# Patient Record
Sex: Female | Born: 1935 | Race: White | Hispanic: No | Smoking: Never smoker
Health system: Southern US, Community
[De-identification: ages and names within clinical notes are randomized; demographics above are authoritative.]

## PROBLEM LIST (undated history)

## (undated) DIAGNOSIS — E079 Disorder of thyroid, unspecified: Secondary | ICD-10-CM

## (undated) DIAGNOSIS — E7800 Pure hypercholesterolemia, unspecified: Secondary | ICD-10-CM

## (undated) DIAGNOSIS — K279 Peptic ulcer, site unspecified, unspecified as acute or chronic, without hemorrhage or perforation: Secondary | ICD-10-CM

## (undated) DIAGNOSIS — F0390 Unspecified dementia without behavioral disturbance: Secondary | ICD-10-CM

## (undated) DIAGNOSIS — K5900 Constipation, unspecified: Secondary | ICD-10-CM

## (undated) HISTORY — PX: TONSILLECTOMY: SUR1361

## (undated) HISTORY — PX: HERNIA REPAIR: SHX51

---

## 1999-12-19 ENCOUNTER — Ambulatory Visit (HOSPITAL_COMMUNITY): Admission: RE | Admit: 1999-12-19 | Discharge: 1999-12-19 | Payer: Self-pay | Admitting: Gastroenterology

## 2001-06-29 ENCOUNTER — Ambulatory Visit (HOSPITAL_BASED_OUTPATIENT_CLINIC_OR_DEPARTMENT_OTHER): Admission: RE | Admit: 2001-06-29 | Discharge: 2001-06-29 | Payer: Self-pay | Admitting: General Surgery

## 2001-07-09 ENCOUNTER — Other Ambulatory Visit: Admission: RE | Admit: 2001-07-09 | Discharge: 2001-07-09 | Payer: Self-pay | Admitting: Family Medicine

## 2002-08-17 ENCOUNTER — Other Ambulatory Visit: Admission: RE | Admit: 2002-08-17 | Discharge: 2002-08-17 | Payer: Self-pay | Admitting: *Deleted

## 2003-09-15 ENCOUNTER — Other Ambulatory Visit: Admission: RE | Admit: 2003-09-15 | Discharge: 2003-09-15 | Payer: Self-pay | Admitting: Family Medicine

## 2004-07-18 ENCOUNTER — Ambulatory Visit (HOSPITAL_COMMUNITY): Admission: RE | Admit: 2004-07-18 | Discharge: 2004-07-18 | Payer: Self-pay | Admitting: Gastroenterology

## 2004-09-13 ENCOUNTER — Other Ambulatory Visit: Admission: RE | Admit: 2004-09-13 | Discharge: 2004-09-13 | Payer: Self-pay | Admitting: Family Medicine

## 2008-03-02 ENCOUNTER — Ambulatory Visit: Admission: RE | Admit: 2008-03-02 | Discharge: 2008-03-02 | Payer: Self-pay | Admitting: Family Medicine

## 2008-03-02 ENCOUNTER — Encounter (INDEPENDENT_AMBULATORY_CARE_PROVIDER_SITE_OTHER): Payer: Self-pay | Admitting: Family Medicine

## 2008-03-02 ENCOUNTER — Ambulatory Visit: Payer: Self-pay | Admitting: Vascular Surgery

## 2009-02-23 ENCOUNTER — Encounter: Admission: RE | Admit: 2009-02-23 | Discharge: 2009-02-23 | Payer: Self-pay | Admitting: Family Medicine

## 2010-02-21 ENCOUNTER — Ambulatory Visit: Payer: Self-pay | Admitting: Vascular Surgery

## 2010-02-21 ENCOUNTER — Ambulatory Visit: Admission: RE | Admit: 2010-02-21 | Discharge: 2010-02-21 | Payer: Self-pay | Admitting: Family Medicine

## 2010-02-21 ENCOUNTER — Encounter (INDEPENDENT_AMBULATORY_CARE_PROVIDER_SITE_OTHER): Payer: Self-pay | Admitting: Family Medicine

## 2010-05-12 ENCOUNTER — Encounter: Payer: Self-pay | Admitting: Family Medicine

## 2010-09-07 NOTE — Op Note (Signed)
NAMEJOURDEN, DELMONT NO.:  [id number]   MEDICAL RECORD NO.:  [id number]          PATIENT TYPE:  AMB   LOCATION:  ENDO                         FACILITY:  [facility]   PHYSICIAN:  Danise Edge, M.D.   DATE OF BIRTH:  Aug 31, 1930   DATE OF PROCEDURE:  07/18/2004  DATE OF DISCHARGE:                                 OPERATIVE REPORT   PROCEDURE:  Surveillance colonoscopy   INDICATIONS:  Ms. Ashley Chambers is a 77 year old female born January 16, 1931. Ms. Ashley Chambers has undergone colonoscopic exams in the past to remove  neoplastic but noncancerous colon polyps. Her surveillance proctocolonoscopy  to the cecum in 2001 was normal.   ENDOSCOPIST:  Danise Edge, M.D.   PREMEDICATION:  Versed 5 mg , Demerol 50 mg.   PROCEDURE:  After obtaining informed consent, Ms. Ashley Chambers was placed in the  left lateral decubitus position. I administered intravenous Demerol and  intravenous Versed to achieve conscious sedation for the procedure. The  patient's blood pressure, oxygen saturation and cardiac rhythm were  monitored throughout the procedure and documented in the medical record.   Anal inspection and digital rectal exam were normal. The Olympus adjustable  pediatric colonoscope was introduced into the rectum and advanced to the  cecum. Colonic preparation exam today was excellent.   Rectum normal.  Sigmoid colon and descending colon normal.  Splenic flexure normal.  Transverse colon normal.  Hepatic flexure normal.  Ascending colon normal.  Cecum and ileocecal valve normal.   ASSESSMENT:  Normal proctocolonoscopy to the cecum.   RECOMMENDATIONS:  Repeat colonoscopy in March 2011.      MJ/MEDQ  D:  07/18/2004  T:  07/18/2004  Job:  [id number]   cc:   Tally Joe, M.D.  [address] [place] [address]  [place], [place] [address]  Fax: [phone]

## 2010-09-07 NOTE — Procedures (Signed)
[facility]  Patient:    Ashley Chambers, Ashley Chambers                        MRN: [id number] Proc. Date: 12/19/99 Attending:  Verlin Grills, M.D. CC:         Willis Modena. Dreiling, M.D.   Procedure Report  PROCEDURE:  Colonoscopy.  INDICATION:  Ashley Chambers is a 77 year old female who has undergone colonoscopic exams in the past with removal of neoplastic, but noncancerous colon polyps.  She is scheduled for surveillance colonoscopy today.  I discussed with Ashley Chambers the complications associated with colonoscopy and polypectomy including a 15 per 1000 risk of bleeding and 4 per 1000 risk of intestinal perforation which would require emergency surgery.  Ashley Chambers has signed the operative permit.  ENDOSCOPIST:  Verlin Grills, M.D.  PREMEDICATION:  Versed 5 mg, Demerol 50 mg.  ENDOSCOPE:  Olympus pediatric colonoscope.  DESCRIPTION OF PROCEDURE:  After obtaining informed consent, the patient was placed in the left lateral decubitus position.  I administered intravenous Demerol and intravenous Versed to achieve conscious sedation for the procedure.  The patients blood pressure, oxygen saturations, and cardiac rhythm were monitored throughout the procedure and documented in the record.  Anal inspection was normal.  Digital rectal exam was normal.  The Olympus pediatric video colonoscope was introduced into the rectum and under direct vision advanced to the cecum, as identified by a normal appearing ileocecal valve and appendiceal orifice.  Colonic preparation for the exam today was excellent.  The patient received Fleets Phospho-Soda preoperatively for the colonoscopy today.  INTRAOPERATIVE FINDINGS: 1. Rectum - normal. 2. Sigmoid colon and descending colon - normal. 3. Splenic flexure - normal. 4. Transverse colon - normal. 5. Hepatic flexure - normal. 6. Ascending colon - normal. 7. Cecum and ileocecal valve - normal.  ASSESSMENT:   Normal proctocolonoscopy to the cecum.  RECOMMENDATIONS:  Repeat colonoscopy in September 2006. DD:  12/19/99 TD:  12/19/99 Job: [id number]

## 2011-02-14 ENCOUNTER — Other Ambulatory Visit (HOSPITAL_BASED_OUTPATIENT_CLINIC_OR_DEPARTMENT_OTHER): Payer: Self-pay | Admitting: Family Medicine

## 2011-02-14 DIAGNOSIS — Z1231 Encounter for screening mammogram for malignant neoplasm of breast: Secondary | ICD-10-CM

## 2011-03-04 ENCOUNTER — Ambulatory Visit (HOSPITAL_BASED_OUTPATIENT_CLINIC_OR_DEPARTMENT_OTHER)
Admission: RE | Admit: 2011-03-04 | Discharge: 2011-03-04 | Disposition: A | Discharge status: Home / Self Care | Payer: PRIVATE HEALTH INSURANCE | Source: Ambulatory Visit | Attending: Family Medicine | Admitting: Family Medicine

## 2011-03-04 DIAGNOSIS — Z1231 Encounter for screening mammogram for malignant neoplasm of breast: Secondary | ICD-10-CM | POA: Insufficient documentation

## 2012-02-27 ENCOUNTER — Other Ambulatory Visit (HOSPITAL_BASED_OUTPATIENT_CLINIC_OR_DEPARTMENT_OTHER): Payer: Self-pay | Admitting: Family Medicine

## 2012-02-27 DIAGNOSIS — Z1231 Encounter for screening mammogram for malignant neoplasm of breast: Secondary | ICD-10-CM

## 2012-03-04 ENCOUNTER — Ambulatory Visit (HOSPITAL_BASED_OUTPATIENT_CLINIC_OR_DEPARTMENT_OTHER)
Admission: RE | Admit: 2012-03-04 | Discharge: 2012-03-04 | Disposition: A | Discharge status: Home / Self Care | Payer: Medicare Other | Source: Ambulatory Visit | Attending: Family Medicine | Admitting: Family Medicine

## 2012-03-04 DIAGNOSIS — Z1231 Encounter for screening mammogram for malignant neoplasm of breast: Secondary | ICD-10-CM | POA: Insufficient documentation

## 2012-04-30 ENCOUNTER — Ambulatory Visit: Payer: Medicare Other | Admitting: Physical Therapy

## 2012-05-04 ENCOUNTER — Ambulatory Visit: Payer: Medicare Other | Attending: Physical Medicine and Rehabilitation | Admitting: Physical Therapy

## 2012-05-04 DIAGNOSIS — M545 Low back pain, unspecified: Secondary | ICD-10-CM | POA: Insufficient documentation

## 2012-05-04 DIAGNOSIS — IMO0001 Reserved for inherently not codable concepts without codable children: Secondary | ICD-10-CM | POA: Insufficient documentation

## 2012-05-08 ENCOUNTER — Ambulatory Visit: Payer: Medicare Other | Admitting: Physical Therapy

## 2012-05-11 ENCOUNTER — Ambulatory Visit: Payer: Medicare Other | Admitting: Physical Therapy

## 2012-05-15 ENCOUNTER — Ambulatory Visit: Payer: Medicare Other | Admitting: Rehabilitation

## 2012-05-18 ENCOUNTER — Ambulatory Visit: Payer: Medicare Other | Admitting: Physical Therapy

## 2012-05-22 ENCOUNTER — Ambulatory Visit: Payer: Medicare Other | Admitting: Rehabilitation

## 2012-05-25 ENCOUNTER — Ambulatory Visit: Payer: Medicare Other | Attending: Physical Medicine and Rehabilitation | Admitting: Physical Therapy

## 2012-05-25 DIAGNOSIS — IMO0001 Reserved for inherently not codable concepts without codable children: Secondary | ICD-10-CM | POA: Insufficient documentation

## 2012-05-25 DIAGNOSIS — M545 Low back pain, unspecified: Secondary | ICD-10-CM | POA: Insufficient documentation

## 2012-05-29 ENCOUNTER — Ambulatory Visit: Payer: Medicare Other | Admitting: Physical Therapy

## 2012-06-01 ENCOUNTER — Ambulatory Visit: Payer: Medicare Other | Admitting: Physical Therapy

## 2012-06-05 ENCOUNTER — Ambulatory Visit: Payer: Medicare Other | Admitting: Physical Therapy

## 2012-12-28 ENCOUNTER — Inpatient Hospital Stay (HOSPITAL_COMMUNITY)
Admission: EM | Admit: 2012-12-28 | Discharge: 2013-01-02 | DRG: 378 | Disposition: A | Discharge status: Home Health | Payer: Medicare Other | Attending: Internal Medicine | Admitting: Internal Medicine

## 2012-12-28 ENCOUNTER — Encounter (HOSPITAL_COMMUNITY): Payer: Self-pay | Admitting: Emergency Medicine

## 2012-12-28 DIAGNOSIS — R195 Other fecal abnormalities: Secondary | ICD-10-CM | POA: Diagnosis present

## 2012-12-28 DIAGNOSIS — A088 Other specified intestinal infections: Secondary | ICD-10-CM

## 2012-12-28 DIAGNOSIS — E875 Hyperkalemia: Secondary | ICD-10-CM | POA: Diagnosis present

## 2012-12-28 DIAGNOSIS — G589 Mononeuropathy, unspecified: Secondary | ICD-10-CM | POA: Diagnosis present

## 2012-12-28 DIAGNOSIS — E7800 Pure hypercholesterolemia, unspecified: Secondary | ICD-10-CM | POA: Diagnosis present

## 2012-12-28 DIAGNOSIS — A084 Viral intestinal infection, unspecified: Secondary | ICD-10-CM

## 2012-12-28 DIAGNOSIS — R1084 Generalized abdominal pain: Secondary | ICD-10-CM | POA: Diagnosis present

## 2012-12-28 DIAGNOSIS — Z860100 Personal history of colon polyps, unspecified: Secondary | ICD-10-CM

## 2012-12-28 DIAGNOSIS — K259 Gastric ulcer, unspecified as acute or chronic, without hemorrhage or perforation: Secondary | ICD-10-CM | POA: Diagnosis present

## 2012-12-28 DIAGNOSIS — R112 Nausea with vomiting, unspecified: Secondary | ICD-10-CM | POA: Diagnosis present

## 2012-12-28 DIAGNOSIS — T3995XA Adverse effect of unspecified nonopioid analgesic, antipyretic and antirheumatic, initial encounter: Secondary | ICD-10-CM | POA: Diagnosis present

## 2012-12-28 DIAGNOSIS — K264 Chronic or unspecified duodenal ulcer with hemorrhage: Principal | ICD-10-CM | POA: Diagnosis present

## 2012-12-28 DIAGNOSIS — K449 Diaphragmatic hernia without obstruction or gangrene: Secondary | ICD-10-CM | POA: Diagnosis present

## 2012-12-28 DIAGNOSIS — E039 Hypothyroidism, unspecified: Secondary | ICD-10-CM | POA: Diagnosis present

## 2012-12-28 DIAGNOSIS — R1013 Epigastric pain: Secondary | ICD-10-CM

## 2012-12-28 DIAGNOSIS — K269 Duodenal ulcer, unspecified as acute or chronic, without hemorrhage or perforation: Secondary | ICD-10-CM

## 2012-12-28 DIAGNOSIS — D62 Acute posthemorrhagic anemia: Secondary | ICD-10-CM | POA: Diagnosis present

## 2012-12-28 DIAGNOSIS — M48061 Spinal stenosis, lumbar region without neurogenic claudication: Secondary | ICD-10-CM | POA: Diagnosis present

## 2012-12-28 DIAGNOSIS — K219 Gastro-esophageal reflux disease without esophagitis: Secondary | ICD-10-CM | POA: Diagnosis present

## 2012-12-28 DIAGNOSIS — E079 Disorder of thyroid, unspecified: Secondary | ICD-10-CM | POA: Diagnosis present

## 2012-12-28 DIAGNOSIS — K922 Gastrointestinal hemorrhage, unspecified: Secondary | ICD-10-CM | POA: Diagnosis present

## 2012-12-28 DIAGNOSIS — R197 Diarrhea, unspecified: Secondary | ICD-10-CM | POA: Diagnosis present

## 2012-12-28 HISTORY — DX: Disorder of thyroid, unspecified: E07.9

## 2012-12-28 HISTORY — DX: Pure hypercholesterolemia, unspecified: E78.00

## 2012-12-28 LAB — CBC WITH DIFFERENTIAL/PLATELET
Basophils Relative: 0 % (ref 0–1)
Eosinophils Absolute: 0 10*3/uL (ref 0.0–0.7)
Lymphs Abs: 1 10*3/uL (ref 0.7–4.0)
MCH: 30.1 pg (ref 26.0–34.0)
Neutrophils Relative %: 89 % — ABNORMAL HIGH (ref 43–77)
Platelets: 277 10*3/uL (ref 150–400)
RBC: 4.39 MIL/uL (ref 3.87–5.11)

## 2012-12-28 LAB — BASIC METABOLIC PANEL
GFR calc Af Amer: >90 mL/min (ref >90)
GFR calc non Af Amer: 89 mL/min — ABNORMAL LOW (ref >90)
Glucose, Bld: 169 mg/dL — ABNORMAL HIGH (ref 70–99)
Potassium: 5.3 mEq/L — ABNORMAL HIGH (ref 3.5–5.1)
Sodium: 140 mEq/L (ref 135–145)

## 2012-12-28 MED ORDER — PANTOPRAZOLE SODIUM 40 MG IV SOLR
40.0000 mg | Freq: Once | INTRAVENOUS | Status: AC
  Administered 2012-12-28: 40 mg via INTRAVENOUS
  Filled 2012-12-28: qty 40

## 2012-12-28 NOTE — H&P (Signed)
Chief Complaint:  N/v/d  HPI: 79 yo overall healthy female comes in after several episodes of n/v/d that started suddenly this am with some generalized abd cramping.  Pt states she suddenly started vomiting, several times this morning then started with profuse diarrhea soonafter.  Noted her vomit to be dark in color and also dark colored stool.  This happened for several hours then came to ED.  In ED she has not had any further abd pain, n,v or dairrhea.  No bleeding.  However her stool was noted to be heme pos by EDP.  Pt denies any sick contacts.  No fevers, but was overall feeling general malaise all day.  No rashes.  Feels much better now after recieveing ivf in ED.  Review of Systems:  Positive and negative as per HPI otherwise all other systems are negative  Past Medical History: Past Medical History  Diagnosis Date  . High cholesterol   . Thyroid disease     Medications: Prior to Admission medications   Medication Sig Start Date End Date Taking? Authorizing Provider  alendronate (FOSAMAX) 70 MG tablet Take 70 mg by mouth every 7 (seven) days. Take with a full glass of water on an empty stomach.   Yes Historical Provider, MD  aspirin 325 MG tablet Take 650 mg by mouth daily.   Yes Historical Provider, MD  calcium carbonate (TUMS - DOSED IN MG ELEMENTAL CALCIUM) 500 MG chewable tablet Chew 2 tablets by mouth as needed for heartburn.   Yes Historical Provider, MD  Cholecalciferol (VITAMIN D3) 400 UNITS CAPS Take 1 capsule by mouth daily.   Yes Historical Provider, MD  co-enzyme Q-10 30 MG capsule Take 30 mg by mouth 3 (three) times daily.   Yes Historical Provider, MD  DiphenhydrAMINE HCl (ALLERGY MED PO) Take 2 tablets by mouth daily.   Yes Historical Provider, MD  Magnesium 100 MG CAPS Take 200 mg by mouth daily.   Yes Historical Provider, MD  Multiple Vitamins-Minerals (CENTRUM SILVER PO) Take 1 tablet by mouth daily.   Yes Historical Provider, MD  Naphazoline HCl (CLEAR EYES OP)  Place 2 drops into both eyes as needed (dry eyes).    Yes Historical Provider, MD  naproxen sodium (ANAPROX) 220 MG tablet Take 220 mg by mouth daily as needed (pain).    Yes Historical Provider, MD  rosuvastatin (CRESTOR) 10 MG tablet Take 10 mg by mouth daily.   Yes Historical Provider, MD  zolpidem (AMBIEN) 5 MG tablet Take 5 mg by mouth at bedtime.   Yes Historical Provider, MD    Allergies:   Allergies  Allergen Reactions  . Sulfa Antibiotics Other (See Comments)    unknown    Social History: Neg x 3 lives at home with husband  Family History: none  Physical Exam: Filed Vitals:   12/28/12 1958 12/28/12 2020  BP: 141/71 160/75  Pulse: 100 92  Temp: 98.2 F (36.8 C)   TempSrc: Oral   Resp: 22 17  SpO2: 100% 96%   General appearance: alert, cooperative and no distress Head: Normocephalic, without obvious abnormality, atraumatic Eyes: negative Nose: Nares normal. Septum midline. Mucosa normal. No drainage or sinus tenderness. Neck: no JVD and supple, symmetrical, trachea midline Lungs: clear to auscultation bilaterally Heart: regular rate and rhythm, S1, S2 normal, no murmur, click, rub or gallop Abdomen: soft, non-tender; bowel sounds normal; no masses,  no organomegaly Extremities: extremities normal, atraumatic, no cyanosis or edema Pulses: 2+ and symmetric Skin: Skin color, texture, turgor normal.  No rashes or lesions Neurologic: Grossly normal  Labs on Admission:   Recent Labs  12/28/12 2056  NA 140  K 5.3*  CL 103  CO2 24  GLUCOSE 169*  BUN 43*  CREATININE 0.51  CALCIUM 9.4    Recent Labs  12/28/12 2056  WBC 14.3*  NEUTROABS 12.8*  HGB 13.2  HCT 39.7  MCV 90.4  PLT 277   Radiological Exams on Admission: No results found.  Assessment/Plan  79 yo female with probably viral gastroenteritis with heme positive stools  Principal Problem:   Viral gastroenteritis Active Problems:   Thyroid disease   High cholesterol   GIB  (gastrointestinal bleeding)   Nausea & vomiting   Diarrhea   Heme positive stool  hgb over 13.  No further symptoms since arrival to ED.  Will obs overnight and serial her h/h a couple of times to monitor for any bleeding.  abd exam is benign.  Provide with zofran prn.  Hold asa products. obs on tele.  Full code.  Nancie Bocanegra A 12/28/2012, 11:31 PM

## 2012-12-28 NOTE — ED Provider Notes (Signed)
CSN: [id number]     Arrival date & time 12/28/12  1950 History   First MD Initiated Contact with Patient 12/28/12 2010     Chief Complaint  Patient presents with  . Rectal Bleeding    HPI Patient presents with vomiting dark colored material along with some melena and hematochezia tonight.  No previous history of GI bleeding or ulcers.  Patient had a normal colonoscopy done 2-3 years ago.  Patient does take 2 aspirins a day and occasionally takes some Naprosyn. Past Medical History  Diagnosis Date  . High cholesterol   . Thyroid disease    No past surgical history on file. No family history on file. History  Substance Use Topics  . Smoking status: Not on file  . Smokeless tobacco: Not on file  . Alcohol Use: Not on file   OB History   Grav Para Term Preterm Abortions TAB SAB Ect Mult Living                 Review of Systems All other systems reviewed and are negative Allergies  Sulfa antibiotics  Home Medications   Current Outpatient Rx  Name  Route  Sig  Dispense  Refill  . alendronate (FOSAMAX) 70 MG tablet   Oral   Take 70 mg by mouth every 7 (seven) days. Take with a full glass of water on an empty stomach.         Marland Kitchen aspirin 325 MG tablet   Oral   Take 650 mg by mouth daily.         . calcium carbonate (TUMS - DOSED IN MG ELEMENTAL CALCIUM) 500 MG chewable tablet   Oral   Chew 2 tablets by mouth as needed for heartburn.         . Cholecalciferol (VITAMIN D3) 400 UNITS CAPS   Oral   Take 1 capsule by mouth daily.         Marland Kitchen co-enzyme Q-10 30 MG capsule   Oral   Take 30 mg by mouth 3 (three) times daily.         . DiphenhydrAMINE HCl (ALLERGY MED PO)   Oral   Take 2 tablets by mouth daily.         . Magnesium 100 MG CAPS   Oral   Take 200 mg by mouth daily.         . Multiple Vitamins-Minerals (CENTRUM SILVER PO)   Oral   Take 1 tablet by mouth daily.         . Naphazoline HCl (CLEAR EYES OP)   Both Eyes   Place 2 drops into both  eyes as needed (dry eyes).          . naproxen sodium (ANAPROX) 220 MG tablet   Oral   Take 220 mg by mouth daily as needed (pain).          . rosuvastatin (CRESTOR) 10 MG tablet   Oral   Take 10 mg by mouth daily.         Marland Kitchen zolpidem (AMBIEN) 5 MG tablet   Oral   Take 5 mg by mouth at bedtime.          BP 160/75  Pulse 92  Temp(Src) 98.2 F (36.8 C) (Oral)  Resp 17  SpO2 96% Physical Exam  Nursing note and vitals reviewed. Constitutional: She is oriented to person, place, and time. She appears well-developed and well-nourished. No distress.  HENT:  Head: Normocephalic and atraumatic.  Eyes:  Pupils are equal, round, and reactive to light.  Neck: Normal range of motion.  Cardiovascular: Normal rate and intact distal pulses.   Pulmonary/Chest: No respiratory distress.  Abdominal: Normal appearance. She exhibits no distension. There is no tenderness. There is no rebound.  Genitourinary: Guaiac positive stool.  Musculoskeletal: Normal range of motion.  Neurological: She is alert and oriented to person, place, and time. No cranial nerve deficit.  Skin: Skin is warm and dry. No rash noted.  Psychiatric: She has a normal mood and affect. Her behavior is normal.    ED Course  Procedures (including critical care time)  Medications  pantoprazole (PROTONIX) injection 40 mg (not administered)    Labs Review Labs Reviewed  CBC WITH DIFFERENTIAL - Abnormal; Notable for the following:    WBC 14.3 (*)    Neutrophils Relative % 89 (*)    Neutro Abs 12.8 (*)    Lymphocytes Relative 7 (*)    All other components within normal limits  BASIC METABOLIC PANEL - Abnormal; Notable for the following:    Potassium 5.3 (*)    Glucose, Bld 169 (*)    BUN 43 (*)    GFR calc non Af Amer 89 (*)    All other components within normal limits  OCCULT BLOOD, POC DEVICE - Abnormal; Notable for the following:    Fecal Occult Bld POSITIVE (*)    All other components within normal limits   OCCULT BLOOD X 1 CARD TO LAB, STOOL   Imaging Review No results found.  MDM   1. GI bleeding        Nelia Shi, MD 12/28/12 [phone]

## 2012-12-28 NOTE — ED Notes (Signed)
Rectal bleeding since this am, no hx of this. States that it is dark in color, not on blood thinners.

## 2012-12-29 ENCOUNTER — Encounter (HOSPITAL_COMMUNITY): Payer: Self-pay | Admitting: *Deleted

## 2012-12-29 DIAGNOSIS — D62 Acute posthemorrhagic anemia: Secondary | ICD-10-CM | POA: Diagnosis present

## 2012-12-29 DIAGNOSIS — E875 Hyperkalemia: Secondary | ICD-10-CM | POA: Diagnosis present

## 2012-12-29 LAB — BASIC METABOLIC PANEL
BUN: 35 mg/dL — ABNORMAL HIGH (ref 6–23)
Chloride: 106 mEq/L (ref 96–112)
GFR calc Af Amer: >90 mL/min (ref >90)
GFR calc non Af Amer: 84 mL/min — ABNORMAL LOW (ref >90)
Glucose, Bld: 132 mg/dL — ABNORMAL HIGH (ref 70–99)
Potassium: 4.4 mEq/L (ref 3.5–5.1)
Sodium: 140 mEq/L (ref 135–145)

## 2012-12-29 LAB — HEMOGLOBIN AND HEMATOCRIT, BLOOD
HCT: 32 % — ABNORMAL LOW (ref 36.0–46.0)
HCT: 22.4 % — ABNORMAL LOW (ref 36.0–46.0)
Hemoglobin: 7.5 g/dL — ABNORMAL LOW (ref 12.0–15.0)
Hemoglobin: 7.4 g/dL — ABNORMAL LOW (ref 12.0–15.0)

## 2012-12-29 LAB — CBC
MCH: 30.2 pg (ref 26.0–34.0)
MCHC: 33.1 g/dL (ref 30.0–36.0)
Platelets: 242 10*3/uL (ref 150–400)
Platelets: 209 10*3/uL (ref 150–400)
RBC: 3.3 MIL/uL — ABNORMAL LOW (ref 3.87–5.11)
RDW: 13.5 % (ref 11.5–15.5)
WBC: 7.4 10*3/uL (ref 4.0–10.5)

## 2012-12-29 MED ORDER — SODIUM CHLORIDE 0.9 % IV SOLN
INTRAVENOUS | Status: AC
  Administered 2012-12-29: 04:00:00 via INTRAVENOUS

## 2012-12-29 MED ORDER — ATORVASTATIN CALCIUM 20 MG PO TABS
20.0000 mg | ORAL_TABLET | Freq: Every day | ORAL | Status: DC
  Administered 2012-12-30 – 2013-01-01 (×3): 20 mg via ORAL
  Filled 2012-12-29 (×5): qty 1

## 2012-12-29 MED ORDER — ZOLPIDEM TARTRATE 5 MG PO TABS
5.0000 mg | ORAL_TABLET | Freq: Every evening | ORAL | Status: DC | PRN
  Administered 2012-12-29: 5 mg via ORAL
  Filled 2012-12-29: qty 1

## 2012-12-29 MED ORDER — ONDANSETRON HCL 4 MG/2ML IJ SOLN: 4.0000 mg | Freq: Three times a day (TID) | INTRAMUSCULAR | Status: AC | PRN

## 2012-12-29 MED ORDER — ONDANSETRON HCL 4 MG/2ML IJ SOLN: 4.0000 mg | Freq: Four times a day (QID) | INTRAMUSCULAR | Status: DC | PRN

## 2012-12-29 MED ORDER — HYDRALAZINE HCL 20 MG/ML IJ SOLN: 10.0000 mg | Freq: Four times a day (QID) | INTRAMUSCULAR | Status: DC | PRN

## 2012-12-29 MED ORDER — SODIUM CHLORIDE 0.9 % IJ SOLN
3.0000 mL | Freq: Two times a day (BID) | INTRAMUSCULAR | Status: DC
  Administered 2012-12-29 – 2013-01-02 (×6): 3 mL via INTRAVENOUS

## 2012-12-29 MED ORDER — ONDANSETRON HCL 4 MG PO TABS: 4.0000 mg | ORAL_TABLET | Freq: Four times a day (QID) | ORAL | Status: DC | PRN

## 2012-12-29 MED ORDER — PANTOPRAZOLE SODIUM 40 MG IV SOLR
40.0000 mg | Freq: Two times a day (BID) | INTRAVENOUS | Status: DC
  Administered 2012-12-29 – 2013-01-01 (×7): 40 mg via INTRAVENOUS
  Filled 2012-12-29 (×8): qty 40

## 2012-12-29 MED ORDER — SODIUM CHLORIDE 0.9 % IV SOLN
INTRAVENOUS | Status: AC
  Administered 2012-12-29 (×2): via INTRAVENOUS

## 2012-12-29 MED ORDER — ZOLPIDEM TARTRATE 5 MG PO TABS
5.0000 mg | ORAL_TABLET | Freq: Every day | ORAL | Status: DC
  Administered 2012-12-29 – 2013-01-01 (×4): 5 mg via ORAL
  Filled 2012-12-29 (×4): qty 1

## 2012-12-29 MED ORDER — ALUM & MAG HYDROXIDE-SIMETH 200-200-20 MG/5ML PO SUSP
30.0000 mL | Freq: Four times a day (QID) | ORAL | Status: DC | PRN
  Administered 2013-01-01: 30 mL via ORAL
  Filled 2012-12-29: qty 30

## 2012-12-29 NOTE — Plan of Care (Cosign Needed)
Pt request "something to sleep"- ordered Ambien 5 mg dose due to age.  Junious Silk, ANP

## 2012-12-29 NOTE — Progress Notes (Signed)
[facility] PROGRESS NOTE  Ashley Chambers ZOX:[id number] DOB: 10/17/1930 DOA: 12/28/2012 PCP: No primary provider on file.  Brief narrative: 79 year old female with past medical history of dyslipidemia who presented to [facility] ED for evaluation of nausea, vomiting and diarrhea for 24 hours prior to this admission. In addition, pt noted to have bright red blood per rectum as well as dark stool. Patient was subsequently found to have FOBT positive and hemoglobin went down from 13.2 to 10.8. She does take aspirin and PRN naproxen at home. GI consulted for possible EGD and colonoscopy.   Assessment/Plan:  Principal Problem:   Viral gastroenteritis - resolved - has mild nausea but no vomiting or diarrhea - may continue IV fluids while NPO for possible EGD and/or colonoscopy Active Problems:   Acute blood loss anemia - likely due to GIB (gastrointestinal bleeding) - hold aspirin and naproxen - appreciate GI consult - continue protonix 40 mg IV Q 12 hours   High cholesterol - continue statin therapy   Hyperkalemia - potassium now WNL   Code Status: full code Family Communication: husband at the bedside Disposition Plan: home when stable  Manson Passey, MD  [facility] Pager [phone]  If 7PM-7AM, please contact night-coverage [email or web address] Password [email or web address] 12/29/2012, 1:28 PM   LOS: 1 day   Consultants:  Gastroenterology Deboraha Sprang)  Procedures:  None   Antibiotics:  None   HPI/Subjective: Says she feels better this am.  Objective: Filed Vitals:   12/29/12 0118 12/29/12 0247 12/29/12 0636 12/29/12 1018  BP: 133/45 140/67 116/56 156/62  Pulse: 88 87 81 81  Temp:  98 F (36.7 C) 98 F (36.7 C) 97.7 F (36.5 C)  TempSrc:  Oral Oral Oral  Resp: 18 18 18 18   Height:  5\' 6"  (1.676 m)    Weight:  56 kg (123 lb 7.3 oz)    SpO2: 94% 100% 99% 100%    Intake/Output Summary (Last 24 hours) at 12/29/12 1328 Last data filed at 12/29/12 1125  Gross per 24 hour   Intake 1133.33 ml  Output    800 ml  Net 333.33 ml    Exam:   General:  Pt is alert, follows commands appropriately, not in acute distress  Cardiovascular: Regular rate and rhythm, S1/S2, no murmurs, no rubs, no gallops  Respiratory: Clear to auscultation bilaterally, no wheezing, no crackles, no rhonchi  Abdomen: Soft, non tender, non distended, bowel sounds present, no guarding  Extremities: No edema, pulses DP and PT palpable bilaterally  Neuro: Grossly nonfocal  Data Reviewed: Basic Metabolic Panel:  Recent Labs Lab 12/28/12 2056 12/29/12 0351  NA 140 140  K 5.3* 4.4  CL 103 106  CO2 24 27  GLUCOSE 169* 132*  BUN 43* 35*  CREATININE 0.51 0.62  CALCIUM 9.4 8.7   Liver Function Tests: No results found for this basename: AST, ALT, ALKPHOS, BILITOT, PROT, ALBUMIN,  in the last 168 hours No results found for this basename: LIPASE, AMYLASE,  in the last 168 hours No results found for this basename: AMMONIA,  in the last 168 hours CBC:  Recent Labs Lab 12/28/12 2056 12/29/12 0351 12/29/12 0955  WBC 14.3* 9.0 7.4  NEUTROABS 12.8*  --   --   HGB 13.2 10.8*  10.8* 10.0*  HCT 39.7 32.0*  32.6* 30.0*  MCV 90.4 91.1 90.9  PLT 277 242 209   Cardiac Enzymes: No results found for this basename: CKTOTAL, CKMB, CKMBINDEX, TROPONINI,  in the last 168 hours BNP: No  components found with this basename: POCBNP,  CBG: No results found for this basename: GLUCAP,  in the last 168 hours  No results found for this or any previous visit (from the past 240 hour(s)).   Studies: No results found.  Scheduled Meds: . sodium chloride   Intravenous STAT  . atorvastatin  20 mg Oral q1800  . pantoprazole (PROTONIX) IV  40 mg Intravenous Q12H  . sodium chloride  3 mL Intravenous Q12H  . zolpidem  5 mg Oral QHS   Continuous Infusions: . sodium chloride 100 mL/hr at 12/29/12 1225

## 2012-12-29 NOTE — Consult Note (Signed)
[facility] CONS[facility] Reason for consult: G.I. bleeding Referring Physician: [facility]. PCP: Dr. Sherrie Mustache is an 79 y.o. female.  HPI: pleasant woman who is admitted with G.I. bleeding. She has had a previous history of colon polyps and has routinely been receiving colonoscopies by Dr. Laural Benes for many years. Her less colonoscopy 2011 was free or polyps and given her age, no repeat was request. It's notable to her previous colonoscopy failed to reveal significant diverticular disease. The patient and her family know that she has had approximately 2 days of nausea and vomiting with a question of coffee ground emesis, links stools with some bright red blood. She's had cramping lower abdominal pain. As to the bright red blood was yesterday and currently, her stools are black. She's had very little teacher in the hospital and is not had very much pain. She does take Naprosyn several times a week for arthritic pain as well as aspirin for cardiovascular prophylaxis. She currently is pain free  Past Medical History  Diagnosis Date  . High cholesterol   . Thyroid disease     History reviewed. No pertinent past surgical history.  History reviewed. No pertinent family history.  Social History:  reports that she has never smoked. She does not have any smokeless tobacco history on file. Her alcohol and drug histories are not on file.  Allergies:  Allergies  Allergen Reactions  . Sulfa Antibiotics Other (See Comments)    unknown    Medications; Prior to Admission medications   Medication Sig Start Date End Date Taking? Authorizing Provider  alendronate (FOSAMAX) 70 MG tablet Take 70 mg by mouth every 7 (seven) days. Take with a full glass of water on an empty stomach.   Yes Historical Provider, MD  aspirin 325 MG tablet Take 650 mg by mouth daily.   Yes Historical Provider, MD  calcium carbonate (TUMS - DOSED IN MG ELEMENTAL CALCIUM) 500 MG chewable tablet Chew 2  tablets by mouth as needed for heartburn.   Yes Historical Provider, MD  Cholecalciferol (VITAMIN D3) 400 UNITS CAPS Take 1 capsule by mouth daily.   Yes Historical Provider, MD  co-enzyme Q-10 30 MG capsule Take 30 mg by mouth 3 (three) times daily.   Yes Historical Provider, MD  DiphenhydrAMINE HCl (ALLERGY MED PO) Take 2 tablets by mouth daily.   Yes Historical Provider, MD  Magnesium 100 MG CAPS Take 200 mg by mouth daily.   Yes Historical Provider, MD  Multiple Vitamins-Minerals (CENTRUM SILVER PO) Take 1 tablet by mouth daily.   Yes Historical Provider, MD  Naphazoline HCl (CLEAR EYES OP) Place 2 drops into both eyes as needed (dry eyes).    Yes Historical Provider, MD  naproxen sodium (ANAPROX) 220 MG tablet Take 220 mg by mouth daily as needed (pain).    Yes Historical Provider, MD  rosuvastatin (CRESTOR) 10 MG tablet Take 10 mg by mouth daily.   Yes Historical Provider, MD  zolpidem (AMBIEN) 5 MG tablet Take 5 mg by mouth at bedtime.   Yes Historical Provider, MD   . sodium chloride   Intravenous STAT  . atorvastatin  20 mg Oral q1800  . pantoprazole (PROTONIX) IV  40 mg Intravenous Q12H  . sodium chloride  3 mL Intravenous Q12H  . zolpidem  5 mg Oral QHS   PRN Meds alum & mag hydroxide-simeth, hydrALAZINE, ondansetron (ZOFRAN) IV, ondansetron (ZOFRAN) IV, ondansetron, zolpidem Results for orders placed during the hospital encounter of 12/28/12 (from the  past 48 hour(s))  CBC WITH DIFFERENTIAL     Status: Abnormal   Collection Time    12/28/12  8:56 PM      Result Value Range   WBC 14.3 (*) 4.0 - 10.5 K/uL   RBC 4.39  3.87 - 5.11 MIL/uL   Hemoglobin 13.2  12.0 - 15.0 g/dL   HCT 16.1  09.6 - 04.5 %   MCV 90.4  78.0 - 100.0 fL   MCH 30.1  26.0 - 34.0 pg   MCHC 33.2  30.0 - 36.0 g/dL   RDW 40.9  81.1 - 91.4 %   Platelets 277  150 - 400 K/uL   Neutrophils Relative % 89 (*) 43 - 77 %   Neutro Abs 12.8 (*) 1.7 - 7.7 K/uL   Lymphocytes Relative 7 (*) 12 - 46 %   Lymphs Abs 1.0   0.7 - 4.0 K/uL   Monocytes Relative 4  3 - 12 %   Monocytes Absolute 0.5  0.1 - 1.0 K/uL   Eosinophils Relative 0  0 - 5 %   Eosinophils Absolute 0.0  0.0 - 0.7 K/uL   Basophils Relative 0  0 - 1 %   Basophils Absolute 0.0  0.0 - 0.1 K/uL  BASIC METABOLIC PANEL     Status: Abnormal   Collection Time    12/28/12  8:56 PM      Result Value Range   Sodium 140  135 - 145 mEq/L   Potassium 5.3 (*) 3.5 - 5.1 mEq/L   Comment: MODERATE HEMOLYSIS     HEMOLYSIS AT THIS LEVEL MAY AFFECT RESULT   Chloride 103  96 - 112 mEq/L   CO2 24  19 - 32 mEq/L   Glucose, Bld 169 (*) 70 - 99 mg/dL   BUN 43 (*) 6 - 23 mg/dL   Creatinine, Ser 7.82  0.50 - 1.10 mg/dL   Calcium 9.4  8.4 - 95.6 mg/dL   GFR calc non Af Amer 89 (*) >90 mL/min   GFR calc Af Amer >90  >90 mL/min   Comment: (NOTE)     The eGFR has been calculated using the CKD EPI equation.     This calculation has not been validated in all clinical situations.     eGFR's persistently <90 mL/min signify possible Chronic Kidney     Disease.  OCCULT BLOOD, POC DEVICE     Status: Abnormal   Collection Time    12/28/12  9:52 PM      Result Value Range   Fecal Occult Bld POSITIVE (*) NEGATIVE  BASIC METABOLIC PANEL     Status: Abnormal   Collection Time    12/29/12  3:51 AM      Result Value Range   Sodium 140  135 - 145 mEq/L   Potassium 4.4  3.5 - 5.1 mEq/L   Comment: DELTA CHECK NOTED   Chloride 106  96 - 112 mEq/L   CO2 27  19 - 32 mEq/L   Glucose, Bld 132 (*) 70 - 99 mg/dL   BUN 35 (*) 6 - 23 mg/dL   Creatinine, Ser 2.13  0.50 - 1.10 mg/dL   Calcium 8.7  8.4 - 08.6 mg/dL   GFR calc non Af Amer 84 (*) >90 mL/min   GFR calc Af Amer >90  >90 mL/min   Comment: (NOTE)     The eGFR has been calculated using the CKD EPI equation.     This calculation has not been validated  in all clinical situations.     eGFR's persistently <90 mL/min signify possible Chronic Kidney     Disease.  HEMOGLOBIN AND HEMATOCRIT, BLOOD     Status: Abnormal    Collection Time    12/29/12  3:51 AM      Result Value Range   Hemoglobin 10.8 (*) 12.0 - 15.0 g/dL   Comment: RESULT REPEATED AND VERIFIED     DELTA CHECK NOTED   HCT 32.0 (*) 36.0 - 46.0 %  CBC     Status: Abnormal   Collection Time    12/29/12  3:51 AM      Result Value Range   WBC 9.0  4.0 - 10.5 K/uL   RBC 3.58 (*) 3.87 - 5.11 MIL/uL   Hemoglobin 10.8 (*) 12.0 - 15.0 g/dL   HCT 96.0 (*) 45.4 - 09.8 %   MCV 91.1  78.0 - 100.0 fL   MCH 30.2  26.0 - 34.0 pg   MCHC 33.1  30.0 - 36.0 g/dL   RDW 11.9  14.7 - 82.9 %   Platelets 242  150 - 400 K/uL  CBC     Status: Abnormal   Collection Time    12/29/12  9:55 AM      Result Value Range   WBC 7.4  4.0 - 10.5 K/uL   RBC 3.30 (*) 3.87 - 5.11 MIL/uL   Hemoglobin 10.0 (*) 12.0 - 15.0 g/dL   HCT 56.2 (*) 13.0 - 86.5 %   MCV 90.9  78.0 - 100.0 fL   MCH 30.3  26.0 - 34.0 pg   MCHC 33.3  30.0 - 36.0 g/dL   RDW 78.4  69.6 - 29.5 %   Platelets 209  150 - 400 K/uL    No results found.             Blood pressure 156/62, pulse 81, temperature 97.7 F (36.5 C), temperature source Oral, resp. rate 18, height 5\' 6"  (1.676 m), weight 56 kg (123 lb 7.3 oz), SpO2 100.00%.  Physical exam:   General-- alert anxious female in no acute distress Heart-- regular rate and rhythm without murmurs are gallops Lungs--clear Abdomen-- none distended in soft with good bowel sounds. Slight left sided tenderness primarily LLQ stool positive for FOB in ER  Assessment: 1. Acute G.I. bleeding. Patient has dropped hemoglobin count to 10. With hematemesis my suspicion is that this is NSAIDs induced ulceration. Her colonoscopies are up today. She has however had some BRB and has LLQ tenderness. The possibility of ischemic colitis could explain this.  Plan: 1. The patient has already had full liquid diet today. We therefore will continue her on PPI therapy empirically and plan EGD in the morning. We'll start her on clear liquids. Since there is a  question of ischemic colitis, who will give her tapwater enemas and criminal proceeding with sigmoidoscopy at the time of her EGD. I have discussed this with her and her family.  Ashley Chambers,Herberth Deharo L 12/29/2012, 1:39 PM

## 2012-12-29 NOTE — Evaluation (Signed)
Physical Therapy Evaluation Patient Details Name: Ashley Chambers MRN: [id number] DOB: [birth year] Today's Date: 12/29/2012 Time: 1240-1250 PT Time Calculation (min): 10 min  PT Assessment / Plan / Recommendation History of Present Illness  79 y.o. female admitted with nausea, vomiting, diarrhea, heme positive stool.   Clinical Impression  **Pt reports that she is normally independent with mobility and ADLs. She independently performed supine to sit and sit to stand but was unable to tolerate standing 2* nausea. Expect pt will progress quickly with mobility once nausea resolves. She will likely not require follow up PT, nor DME. Will follow during acute stay to maximize safety and independence with mobility. *    PT Assessment  Patient needs continued PT services    Follow Up Recommendations  No PT follow up    Does the patient have the potential to tolerate intense rehabilitation      Barriers to Discharge        Equipment Recommendations  None recommended by PT    Recommendations for Other Services     Frequency Min 3X/week    Precautions / Restrictions Precautions Precautions: Fall Restrictions Weight Bearing Restrictions: No   Pertinent Vitals/Pain **0/10 pain*      Mobility  Bed Mobility Bed Mobility: Supine to Sit Supine to Sit: 6: Modified independent (Device/Increase time);With rails Transfers Transfers: Sit to Stand;Stand to Sit Sit to Stand: 4: Min guard;From bed Stand to Sit: 4: Min guard;To bed Details for Transfer Assistance: min/guard for safety    Exercises     PT Diagnosis: Generalized weakness  PT Problem List: Decreased strength;Decreased activity tolerance;Decreased mobility PT Treatment Interventions: Gait training;Functional mobility training;Therapeutic activities;Therapeutic exercise;Patient/family education     PT Goals(Current goals can be found in the care plan section) Acute Rehab PT Goals Patient Stated Goal: to return to  independence with mobility PT Goal Formulation: With patient Time For Goal Achievement: 01/12/13 Potential to Achieve Goals: Good  Visit Information  Last PT Received On: 12/29/12 Assistance Needed: +1 History of Present Illness: 79 y.o. female admitted with nausea, vomiting, diarrhea, heme positive stool.        Prior Functioning  Home Living Family/patient expects to be discharged to:: Private residence Living Arrangements: Spouse/significant other Available Help at Discharge: Family Home Layout: One level Prior Function Level of Independence: Independent Communication Communication: No difficulties    Cognition  Cognition Arousal/Alertness: Awake/alert (exhausted from no sleep last night) Behavior During Therapy: WFL for tasks assessed/performed Overall Cognitive Status: Within Functional Limits for tasks assessed    Extremity/Trunk Assessment Upper Extremity Assessment Upper Extremity Assessment: Overall WFL for tasks assessed Lower Extremity Assessment Lower Extremity Assessment: Generalized weakness (-4/5 knee extension) Cervical / Trunk Assessment Cervical / Trunk Assessment: Normal   Balance Static Sitting Balance Static Sitting - Balance Support: Bilateral upper extremity supported;Feet supported Static Sitting - Level of Assistance: 7: Independent Static Sitting - Comment/# of Minutes: 2 Static Standing Balance Static Standing - Balance Support: No upper extremity supported Static Standing - Level of Assistance: 5: Stand by assistance Static Standing - Comment/# of Minutes: 1 minute -tolerance limited by nausea/fatigue  End of Session PT - End of Session Activity Tolerance: Patient limited by fatigue;Treatment limited secondary to medical complications (Comment) (nausea) Patient left: in bed;with call bell/phone within reach;with family/visitor present Nurse Communication: Mobility status  GP Functional Assessment Tool Used: clinical judgement Functional  Limitation: Mobility: Walking and moving around Mobility: Walking and Moving Around Current Status (Q0347): At least 60 percent but less than 80  percent impaired, limited or restricted Mobility: Walking and Moving Around Goal Status [phone]): At least 1 percent but less than 20 percent impaired, limited or restricted   Tamala Ser 12/29/2012, 12:57 PM [phone]

## 2012-12-29 NOTE — Plan of Care (Cosign Needed)
RN called earlier in evening with hgb 7.5- confirmatory H/H done and hgb 7.4. Will transfuse 2 units PRBC's each over 4 hours since pt hemodynamically stable and not actively bleeding  Junious Silk, ANP

## 2012-12-29 NOTE — Progress Notes (Signed)
Patient had small dark red stool X2. Will continue to monitor

## 2012-12-30 ENCOUNTER — Encounter (HOSPITAL_COMMUNITY): Payer: Self-pay

## 2012-12-30 ENCOUNTER — Encounter (HOSPITAL_COMMUNITY)
Admission: EM | Disposition: A | Discharge status: Home Health | Payer: Self-pay | Source: Home / Self Care | Attending: Internal Medicine

## 2012-12-30 DIAGNOSIS — E875 Hyperkalemia: Secondary | ICD-10-CM

## 2012-12-30 DIAGNOSIS — D62 Acute posthemorrhagic anemia: Secondary | ICD-10-CM

## 2012-12-30 DIAGNOSIS — E7800 Pure hypercholesterolemia, unspecified: Secondary | ICD-10-CM

## 2012-12-30 HISTORY — PX: ESOPHAGOGASTRODUODENOSCOPY: SHX5428

## 2012-12-30 HISTORY — PX: FLEXIBLE SIGMOIDOSCOPY: SHX5431

## 2012-12-30 SURGERY — EGD (ESOPHAGOGASTRODUODENOSCOPY)
Anesthesia: Moderate Sedation

## 2012-12-30 MED ORDER — DIPHENHYDRAMINE HCL 50 MG/ML IJ SOLN
INTRAMUSCULAR | Status: AC
  Filled 2012-12-30: qty 1

## 2012-12-30 MED ORDER — FENTANYL CITRATE 0.05 MG/ML IJ SOLN
INTRAMUSCULAR | Status: DC | PRN
  Administered 2012-12-30 (×3): 25 ug via INTRAVENOUS

## 2012-12-30 MED ORDER — MIDAZOLAM HCL 10 MG/2ML IJ SOLN
INTRAMUSCULAR | Status: DC | PRN
  Administered 2012-12-30: 2 mg via INTRAVENOUS
  Administered 2012-12-30: 1 mg via INTRAVENOUS
  Administered 2012-12-30: 2 mg via INTRAVENOUS

## 2012-12-30 MED ORDER — EPINEPHRINE HCL 0.1 MG/ML IJ SOSY
PREFILLED_SYRINGE | INTRAMUSCULAR | Status: AC
  Filled 2012-12-30: qty 10

## 2012-12-30 MED ORDER — FENTANYL CITRATE 0.05 MG/ML IJ SOLN
INTRAMUSCULAR | Status: AC
  Filled 2012-12-30: qty 2

## 2012-12-30 MED ORDER — SODIUM CHLORIDE 0.9 % IJ SOLN
PREFILLED_SYRINGE | INTRAMUSCULAR | Status: DC | PRN
  Administered 2012-12-30: 09:00:00

## 2012-12-30 MED ORDER — MIDAZOLAM HCL 10 MG/2ML IJ SOLN
INTRAMUSCULAR | Status: AC
  Filled 2012-12-30: qty 2

## 2012-12-30 MED ORDER — SODIUM CHLORIDE 0.9 % IV SOLN
INTRAVENOUS | Status: DC
  Administered 2012-12-30: 500 mL via INTRAVENOUS

## 2012-12-30 MED ORDER — SUCRALFATE 1 GM/10ML PO SUSP
1.0000 g | Freq: Three times a day (TID) | ORAL | Status: DC
  Administered 2012-12-30 – 2013-01-02 (×12): 1 g via ORAL
  Filled 2012-12-30 (×15): qty 10

## 2012-12-30 MED ORDER — BUTAMBEN-TETRACAINE-BENZOCAINE 2-2-14 % EX AERO
INHALATION_SPRAY | CUTANEOUS | Status: DC | PRN
  Administered 2012-12-30: 2 via TOPICAL

## 2012-12-30 NOTE — Progress Notes (Signed)
Pt received 2 units of PRBCs during the night and tolerated the transfusion well. SRP, RN

## 2012-12-30 NOTE — H&P (View-Only) (Signed)
[facility] Reason for consult: G.I. bleeding Referring Physician: [facility]. PCP: Dr. Swayne  Ashley Chambers is an 79 y.o. female.  HPI: pleasant woman who is admitted with G.I. bleeding. She has had a previous history of colon polyps and has routinely been receiving colonoscopies by Dr. Johnson for many years. Her less colonoscopy 2011 was free or polyps and given her age, no repeat was request. It's notable to her previous colonoscopy failed to reveal significant diverticular disease. The patient and her family know that she has had approximately 2 days of nausea and vomiting with a question of coffee ground emesis, links stools with some bright red blood. She's had cramping lower abdominal pain. As to the bright red blood was yesterday and currently, her stools are black. She's had very little teacher in the hospital and is not had very much pain. She does take Naprosyn several times a week for arthritic pain as well as aspirin for cardiovascular prophylaxis. She currently is pain free  Past Medical History  Diagnosis Date  . High cholesterol   . Thyroid disease     History reviewed. No pertinent past surgical history.  History reviewed. No pertinent family history.  Social History:  reports that she has never smoked. She does not have any smokeless tobacco history on file. Her alcohol and drug histories are not on file.  Allergies:  Allergies  Allergen Reactions  . Sulfa Antibiotics Other (See Comments)    unknown    Medications; Prior to Admission medications   Medication Sig Start Date End Date Taking? Authorizing Provider  alendronate (FOSAMAX) 70 MG tablet Take 70 mg by mouth every 7 (seven) days. Take with a full glass of water on an empty stomach.   Yes Historical Provider, MD  aspirin 325 MG tablet Take 650 mg by mouth daily.   Yes Historical Provider, MD  calcium carbonate (TUMS - DOSED IN MG ELEMENTAL CALCIUM) 500 MG chewable tablet Chew 2  tablets by mouth as needed for heartburn.   Yes Historical Provider, MD  Cholecalciferol (VITAMIN D3) 400 UNITS CAPS Take 1 capsule by mouth daily.   Yes Historical Provider, MD  co-enzyme Q-10 30 MG capsule Take 30 mg by mouth 3 (three) times daily.   Yes Historical Provider, MD  DiphenhydrAMINE HCl (ALLERGY MED PO) Take 2 tablets by mouth daily.   Yes Historical Provider, MD  Magnesium 100 MG CAPS Take 200 mg by mouth daily.   Yes Historical Provider, MD  Multiple Vitamins-Minerals (CENTRUM SILVER PO) Take 1 tablet by mouth daily.   Yes Historical Provider, MD  Naphazoline HCl (CLEAR EYES OP) Place 2 drops into both eyes as needed (dry eyes).    Yes Historical Provider, MD  naproxen sodium (ANAPROX) 220 MG tablet Take 220 mg by mouth daily as needed (pain).    Yes Historical Provider, MD  rosuvastatin (CRESTOR) 10 MG tablet Take 10 mg by mouth daily.   Yes Historical Provider, MD  zolpidem (AMBIEN) 5 MG tablet Take 5 mg by mouth at bedtime.   Yes Historical Provider, MD   . sodium chloride   Intravenous STAT  . atorvastatin  20 mg Oral q1800  . pantoprazole (PROTONIX) IV  40 mg Intravenous Q12H  . sodium chloride  3 mL Intravenous Q12H  . zolpidem  5 mg Oral QHS   PRN Meds alum & mag hydroxide-simeth, hydrALAZINE, ondansetron (ZOFRAN) IV, ondansetron (ZOFRAN) IV, ondansetron, zolpidem Results for orders placed during the hospital encounter of 12/28/12 (from the   past 48 hour(s))  CBC WITH DIFFERENTIAL     Status: Abnormal   Collection Time    12/28/12  8:56 PM      Result Value Range   WBC 14.3 (*) 4.0 - 10.5 K/uL   RBC 4.39  3.87 - 5.11 MIL/uL   Hemoglobin 13.2  12.0 - 15.0 g/dL   HCT 39.7  36.0 - 46.0 %   MCV 90.4  78.0 - 100.0 fL   MCH 30.1  26.0 - 34.0 pg   MCHC 33.2  30.0 - 36.0 g/dL   RDW 13.2  11.5 - 15.5 %   Platelets 277  150 - 400 K/uL   Neutrophils Relative % 89 (*) 43 - 77 %   Neutro Abs 12.8 (*) 1.7 - 7.7 K/uL   Lymphocytes Relative 7 (*) 12 - 46 %   Lymphs Abs 1.0   0.7 - 4.0 K/uL   Monocytes Relative 4  3 - 12 %   Monocytes Absolute 0.5  0.1 - 1.0 K/uL   Eosinophils Relative 0  0 - 5 %   Eosinophils Absolute 0.0  0.0 - 0.7 K/uL   Basophils Relative 0  0 - 1 %   Basophils Absolute 0.0  0.0 - 0.1 K/uL  BASIC METABOLIC PANEL     Status: Abnormal   Collection Time    12/28/12  8:56 PM      Result Value Range   Sodium 140  135 - 145 mEq/L   Potassium 5.3 (*) 3.5 - 5.1 mEq/L   Comment: MODERATE HEMOLYSIS     HEMOLYSIS AT THIS LEVEL MAY AFFECT RESULT   Chloride 103  96 - 112 mEq/L   CO2 24  19 - 32 mEq/L   Glucose, Bld 169 (*) 70 - 99 mg/dL   BUN 43 (*) 6 - 23 mg/dL   Creatinine, Ser 0.51  0.50 - 1.10 mg/dL   Calcium 9.4  8.4 - 10.5 mg/dL   GFR calc non Af Amer 89 (*) >90 mL/min   GFR calc Af Amer >90  >90 mL/min   Comment: (NOTE)     The eGFR has been calculated using the CKD EPI equation.     This calculation has not been validated in all clinical situations.     eGFR's persistently <90 mL/min signify possible Chronic Kidney     Disease.  OCCULT BLOOD, POC DEVICE     Status: Abnormal   Collection Time    12/28/12  9:52 PM      Result Value Range   Fecal Occult Bld POSITIVE (*) NEGATIVE  BASIC METABOLIC PANEL     Status: Abnormal   Collection Time    12/29/12  3:51 AM      Result Value Range   Sodium 140  135 - 145 mEq/L   Potassium 4.4  3.5 - 5.1 mEq/L   Comment: DELTA CHECK NOTED   Chloride 106  96 - 112 mEq/L   CO2 27  19 - 32 mEq/L   Glucose, Bld 132 (*) 70 - 99 mg/dL   BUN 35 (*) 6 - 23 mg/dL   Creatinine, Ser 0.62  0.50 - 1.10 mg/dL   Calcium 8.7  8.4 - 10.5 mg/dL   GFR calc non Af Amer 84 (*) >90 mL/min   GFR calc Af Amer >90  >90 mL/min   Comment: (NOTE)     The eGFR has been calculated using the CKD EPI equation.     This calculation has not been validated   in all clinical situations.     eGFR's persistently <90 mL/min signify possible Chronic Kidney     Disease.  HEMOGLOBIN AND HEMATOCRIT, BLOOD     Status: Abnormal    Collection Time    12/29/12  3:51 AM      Result Value Range   Hemoglobin 10.8 (*) 12.0 - 15.0 g/dL   Comment: RESULT REPEATED AND VERIFIED     DELTA CHECK NOTED   HCT 32.0 (*) 36.0 - 46.0 %  CBC     Status: Abnormal   Collection Time    12/29/12  3:51 AM      Result Value Range   WBC 9.0  4.0 - 10.5 K/uL   RBC 3.58 (*) 3.87 - 5.11 MIL/uL   Hemoglobin 10.8 (*) 12.0 - 15.0 g/dL   HCT 32.6 (*) 36.0 - 46.0 %   MCV 91.1  78.0 - 100.0 fL   MCH 30.2  26.0 - 34.0 pg   MCHC 33.1  30.0 - 36.0 g/dL   RDW 13.4  11.5 - 15.5 %   Platelets 242  150 - 400 K/uL  CBC     Status: Abnormal   Collection Time    12/29/12  9:55 AM      Result Value Range   WBC 7.4  4.0 - 10.5 K/uL   RBC 3.30 (*) 3.87 - 5.11 MIL/uL   Hemoglobin 10.0 (*) 12.0 - 15.0 g/dL   HCT 30.0 (*) 36.0 - 46.0 %   MCV 90.9  78.0 - 100.0 fL   MCH 30.3  26.0 - 34.0 pg   MCHC 33.3  30.0 - 36.0 g/dL   RDW 13.5  11.5 - 15.5 %   Platelets 209  150 - 400 K/uL    No results found.             Blood pressure 156/62, pulse 81, temperature 97.7 F (36.5 C), temperature source Oral, resp. rate 18, height 5' 6" (1.676 m), weight 56 kg (123 lb 7.3 oz), SpO2 100.00%.  Physical exam:   General-- alert anxious female in no acute distress Heart-- regular rate and rhythm without murmurs are gallops Lungs--clear Abdomen-- none distended in soft with good bowel sounds. Slight left sided tenderness primarily LLQ stool positive for FOB in ER  Assessment: 1. Acute G.I. bleeding. Patient has dropped hemoglobin count to 10. With hematemesis my suspicion is that this is NSAIDs induced ulceration. Her colonoscopies are up today. She has however had some BRB and has LLQ tenderness. The possibility of ischemic colitis could explain this.  Plan: 1. The patient has already had full liquid diet today. We therefore will continue her on PPI therapy empirically and plan EGD in the morning. We'll start her on clear liquids. Since there is a  question of ischemic colitis, who will give her tapwater enemas and criminal proceeding with sigmoidoscopy at the time of her EGD. I have discussed this with her and her family.  Jackie Russman JR,Kaylib Furness L 12/29/2012, 1:39 PM      

## 2012-12-30 NOTE — Progress Notes (Signed)
PT Cancellation Note  Patient Details Name: Ashley Chambers MRN: [id number] DOB: 10/09/1930   Cancelled Treatment:    Reason Eval/Treat Not Completed: Patient at procedure or test/unavailable   [facility] 12/30/2012, 8:47 AM

## 2012-12-30 NOTE — Interval H&P Note (Signed)
History and Physical Interval Note:  12/30/2012 8:36 AM  Ashley Chambers  has presented today for surgery, with the diagnosis of GI bleeding  The various methods of treatment have been discussed with the patient and family. After consideration of risks, benefits and other options for treatment, the patient has consented to  Procedure(s): ESOPHAGOGASTRODUODENOSCOPY (EGD) (N/A) FLEXIBLE SIGMOIDOSCOPY (N/A) as a surgical intervention .  The patient's history has been reviewed, patient examined, no change in status, stable for surgery.  I have reviewed the patient's chart and labs.  Questions were answered to the patient's satisfaction.     Sharesa Kemp JR,Patrick Sohm L

## 2012-12-30 NOTE — Op Note (Signed)
[facility] [address] [place], [address]   ENDOSCOPY PROCEDURE REPORT  PATIENT: Ashley Chambers, Ashley Chambers  MR#: [id number] BIRTHDATE: 11-17-1930 , 81  yrs. old GENDER: Female ENDOSCOPIST:Ralphael Southgate Randa Evens, MD REFERRED BY:  [facility]. PCP: Dr. Azucena Cecil PROCEDURE DATE:  12/30/2012 PROCEDURE:   EGD with Control of Bleeding ASA CLASS:  class III INDICATIONS: G.I. bleeding with melena and some bright red blood. Patient had history of aspirin Naprosyn use MEDICATION:    fentanyl 75 mcg, versed 5 mg IV TOPICAL ANESTHETIC:     cetacaine spray  DESCRIPTION OF PROCEDURE:   The Pentax upper scope was inserted finally into the esophagus with swallowing. The esophagus was normal other than a moderate hiatal hernia. Coffee ground material was seen in the stomach. The stomach was vigorously your gated in a small gastric ulcer as well as some gastric or oceans were seen in the antrum that were not actively bleeding. Some bright blood was seen coming through the pyloric channel. Upon entering the duodenum it was bright blood there and it was vigorously irrigated. On the anterior wall was a small duodenal ulcer with adherent clot using. This continued to drip blood despite vigorous irrigation. I then placed endoclip around the clot in ulcer with good control of bleeding. We injected 1 1/2 mL of epinephrine 1:10,003 injections around the edges of clot in ulcer. At the termination of the procedure there was no active bleeding. This was vigorously irrigated in no bleeding was seen. The clip was seen to be firmly attach around the ulcer and clot. The patient tolerated procedure well there were no immediate complications.     COMPLICATIONS: None  ENDOSCOPIC IMPRESSION: 1. Small Duodenal Ulcer actively bleeding treated with endoclip and epinephrine injection. 2. Small Gastric Ulcers. Not bleeding. Ulcers probably do to Naprosyn  RECOMMENDATIONS: 1. continue aggressive PPI  therapy and support. 2. We'll keep all clear liquids for now. If she continues to lead, we can make another attempt to control in the bleeding endoscopically prior to considering surgical control bleeding.    _______________________________ eSigned:  Carman Ching, MD 12/30/2012 9:25 AM CC: Dr. Azucena Cecil    PATIENT NAME:  [name], [name] MR#: [id number]

## 2012-12-30 NOTE — Op Note (Signed)
[facility] [address] [place], [address]   FLEXIBLE SIGMOIDOSCOPY PROCEDURE REPORT  PATIENT: Ashley Chambers, Ashley Chambers  MR#: [id number] BIRTHDATE: 16-Apr-1931 , 81  yrs. old GENDER: Female ENDOSCOPIST: Carman Ching, MD REFERRED BY:  [facility] PROCEDURE DATE:  12/30/2012 PROCEDURE:  Flexible Sigmoidoscopy ASA CLASS:   class III INDICATIONS: G.I. bleeding with melena as well as some bright red blood. Patient prepped with tap water enema felt that we should evaluate the lower G.I. track just to be sure she did not have rectal/sigmoid colon tumor or ischemia. Following EGD sigmoidoscopy performed. MEDICATIONS:  fentanyl 75 mcg, versed 5 mg IV for EGD no additional sedation  DESCRIPTION OF PROCEDURE:   .Following the EGD a digital exam was performed revealing dark stool. The upper scope was inserted in advance. Her rectum was filled with melenic stool. We advanced up to about 25 cm and there was no signs of ischemia or bright red blood. No clear diverticulosis or masses were seen. Due to the poor prep, no attempts were made to advance further.        COMPLICATIONS:  None  ENDOSCOPIC IMPRESSION: 1. Melenic Stool. Probably from upper G.I. bleeding no obvious source of bleeding in the rectosigmoid area  RECOMMENDATIONS: 1. we will continue to treat her upper G.I. bleeding.   _______________________________ Rosalie DoctorCarman Ching, MD 12/30/2012 9:30 AM

## 2012-12-30 NOTE — Progress Notes (Signed)
[facility] PROGRESS NOTE  MARCIANNA DAILY WUJ:[id number] DOB: [birth year] DOA: 12/28/2012 PCP: No primary provider on file.  Assessment/Plan: Nausea/vomiting -Now  resolved -most likely secondary to dyspepsia due to PUD -will slowly advance diet -IVF's changed to [facility]  Acute blood loss anemia - secondary to duodenal ulcer; most likely induce from NSAID's use -s/p epinephrine injection and clipping by GI (12/30/12) hold -will hold ASA at least for 2 weeks  And will recommend no further use of NSAID's -appreciate GI consult, assistance and rec's -continue protonix 40 mg IV Q 12 hours -will follow Hgb trend; has received 4 units of PRBC's    High cholesterol - continue statin therapy    Hyperkalemia -potassium now WNL -will monitor  Code Status: full code Family Communication: husband an son at the bedside Disposition Plan: home when stable  Melissa Tomaselli, MD  [facility] Pager [phone]  If 7PM-7AM, please contact night-coverage [email or web address] Password [email or web address] 12/30/2012, 3:44 PM   LOS: 2 days   Consultants:  Gastroenterology Deboraha Sprang)  Procedures:  EGD (positive acutely bleeding duodenal ulcer)  Antibiotics:  None   HPI/Subjective: Lethargic/somnolent after EGD; no CP, no SOB. Reports no abd pain.  Objective: Filed Vitals:   12/30/12 0915 12/30/12 0916 12/30/12 0950 12/30/12 1318  BP: 133/64 134/70 100/46 114/48  Pulse:  85 59 72  Temp:  97.7 F (36.5 C) 97.4 F (36.3 C) 97.4 F (36.3 C)  TempSrc:  Oral Oral Oral  Resp: 43 14 16 18   Height:      Weight:      SpO2: 99% 99% 100% 100%    Intake/Output Summary (Last 24 hours) at 12/30/12 1544 Last data filed at 12/30/12 0615  Gross per 24 hour  Intake    770 ml  Output    301 ml  Net    469 ml    Exam:   General:  Pt is alert, follows commands appropriately, not in acute distress; somnolent and lethargic from versed given during EGD.  Cardiovascular: Regular rate and rhythm, S1/S2, no  murmurs, no rubs, no gallops  Respiratory: Clear to auscultation bilaterally, no wheezing, no crackles, no rhonchi  Abdomen: Soft, non tender, non distended, bowel sounds present, no guarding  Extremities: No edema, pulses DP and PT palpable bilaterally  Neuro: Grossly nonfocal  Data Reviewed: Basic Metabolic Panel:  Recent Labs Lab 12/28/12 2056 12/29/12 0351  NA 140 140  K 5.3* 4.4  CL 103 106  CO2 24 27  GLUCOSE 169* 132*  BUN 43* 35*  CREATININE 0.51 0.62  CALCIUM 9.4 8.7   CBC:  Recent Labs Lab 12/28/12 2056 12/29/12 0351 12/29/12 0955 12/29/12 1902 12/29/12 2210 12/30/12 0800  WBC 14.3* 9.0 7.4  --   --   --   NEUTROABS 12.8*  --   --   --   --   --   HGB 13.2 10.8*  10.8* 10.0* 7.5* 7.4* 9.4*  HCT 39.7 32.0*  32.6* 30.0* 22.4* 22.4* 27.2*  MCV 90.4 91.1 90.9  --   --   --   PLT 277 242 209  --   --   --    Studies: No results found.  Scheduled Meds: . atorvastatin  20 mg Oral q1800  . pantoprazole (PROTONIX) IV  40 mg Intravenous Q12H  . sodium chloride  3 mL Intravenous Q12H  . zolpidem  5 mg Oral QHS   Continuous Infusions:

## 2012-12-31 ENCOUNTER — Encounter (HOSPITAL_COMMUNITY): Payer: Self-pay | Admitting: Gastroenterology

## 2012-12-31 LAB — CBC
MCH: 30.7 pg (ref 26.0–34.0)
MCHC: 34.9 g/dL (ref 30.0–36.0)
RDW: 15.1 % (ref 11.5–15.5)
WBC: 7 10*3/uL (ref 4.0–10.5)

## 2012-12-31 LAB — TYPE AND SCREEN
ABO/RH(D): O POS
Antibody Screen: NEGATIVE
Unit division: 0

## 2012-12-31 MED ORDER — TROLAMINE SALICYLATE 10 % EX CREA: TOPICAL_CREAM | CUTANEOUS | Status: DC | PRN

## 2012-12-31 MED ORDER — POLYETHYLENE GLYCOL 3350 17 G PO PACK
17.0000 g | PACK | Freq: Three times a day (TID) | ORAL | Status: DC
  Administered 2012-12-31 – 2013-01-02 (×7): 17 g via ORAL
  Filled 2012-12-31 (×10): qty 1

## 2012-12-31 MED ORDER — ACETAMINOPHEN 500 MG PO TABS
500.0000 mg | ORAL_TABLET | Freq: Four times a day (QID) | ORAL | Status: DC | PRN
  Administered 2012-12-31: 500 mg via ORAL
  Filled 2012-12-31: qty 1

## 2012-12-31 MED ORDER — MUSCLE RUB 10-15 % EX CREA
TOPICAL_CREAM | CUTANEOUS | Status: DC | PRN
  Administered 2012-12-31 (×3): via TOPICAL
  Filled 2012-12-31: qty 85

## 2012-12-31 NOTE — Progress Notes (Signed)
[facility] PROGRESS NOTE  Ashley Chambers ZOX:[id number] DOB: 10-19-1930 DOA: 12/28/2012 PCP: No primary provider on file.  Assessment/Plan: Nausea/vomiting -Now  resolved -most likely secondary to dyspepsia due to PUD -will advance diet to full liquid -IVF's changed to NSL now.  Acute blood loss anemia -secondary to duodenal ulcer; most likely induce from NSAID's use -s/p epinephrine injection and clipping by GI (12/30/12)  -will hold ASA at least for 2 weeks  And will recommend no further use of NSAID's -appreciate GI consult, assistance and rec's -continue protonix 40 mg IV Q 12 hours and carafate -will follow Hgb trend; has received 4 units of PRBC's total -Hgb this morning is 8.0; patient had some black stools (but could be old blood seen during EGD) -will repeat CBC in am    High cholesterol -continue statin therapy    Hyperkalemia -potassium now WNL  Code Status: full code Family Communication: husband an son at the bedside Disposition Plan: home when stable  Wanell Lorenzi, MD  [facility] Pager [phone]  If 7PM-7AM, please contact night-coverage [email or web address] Password [email or web address] 12/31/2012, 4:35 PM   LOS: 3 days   Consultants:  Gastroenterology Deboraha Sprang)  Procedures:  EGD (positive acutely bleeding duodenal ulcer)  Antibiotics:  None   HPI/Subjective: AAOX3, NAD; wants to eat. no CP, no SOB. Reports no abd pain, no nausea or vomiting   Objective: Filed Vitals:   12/30/12 1318 12/30/12 2117 12/31/12 0435 12/31/12 1333  BP: 114/48 127/52 128/60 109/53  Pulse: 72 79 80 97  Temp: 97.4 F (36.3 C) 98.2 F (36.8 C) 98.2 F (36.8 C) 98.4 F (36.9 C)  TempSrc: Oral Oral Oral Oral  Resp: 18 18 18 20   Height:      Weight:      SpO2: 100% 97% 98% 98%    Intake/Output Summary (Last 24 hours) at 12/31/12 1635 Last data filed at 12/31/12 0900  Gross per 24 hour  Intake    360 ml  Output      0 ml  Net    360 ml    Exam:   General:  Pt  is alert, follows commands appropriately, not in acute distress; is hungry and will like to eat  Cardiovascular: Regular rate and rhythm, S1/S2, no murmurs, no rubs, no gallops  Respiratory: Clear to auscultation bilaterally, no wheezing, no crackles, no rhonchi  Abdomen: Soft, non tender, non distended, bowel sounds present, no guarding  Extremities: No edema, pulses DP and PT palpable bilaterally  Neuro: Grossly nonfocal  Data Reviewed: Basic Metabolic Panel:  Recent Labs Lab 12/28/12 2056 12/29/12 0351  NA 140 140  K 5.3* 4.4  CL 103 106  CO2 24 27  GLUCOSE 169* 132*  BUN 43* 35*  CREATININE 0.51 0.62  CALCIUM 9.4 8.7   CBC:  Recent Labs Lab 12/28/12 2056 12/29/12 0351 12/29/12 0955 12/29/12 1902 12/29/12 2210 12/30/12 0800 12/31/12 0414  WBC 14.3* 9.0 7.4  --   --   --  7.0  NEUTROABS 12.8*  --   --   --   --   --   --   HGB 13.2 10.8*  10.8* 10.0* 7.5* 7.4* 9.4* 8.0*  HCT 39.7 32.0*  32.6* 30.0* 22.4* 22.4* 27.2* 22.9*  MCV 90.4 91.1 90.9  --   --   --  87.7  PLT 277 242 209  --   --   --  146*   Studies: No results found.  Scheduled Meds: . atorvastatin  20 mg Oral q1800  .  pantoprazole (PROTONIX) IV  40 mg Intravenous Q12H  . polyethylene glycol  17 g Oral TID  . sodium chloride  3 mL Intravenous Q12H  . sucralfate  1 g Oral TID AC & HS  . zolpidem  5 mg Oral QHS   Continuous Infusions:

## 2012-12-31 NOTE — Progress Notes (Signed)
[facility] PROGRESS NOTE Subjective patient with no complaints. She has had one or 2 dark stools since yesterday. She notes that this is definitely improved. No abdominal pain.  Objective: Vital signs in last 24 hours: Temp:  [97.4 F (36.3 C)-98.2 F (36.8 C)] 98.2 F (36.8 C) (09/11 0435) Pulse Rate:  [59-80] 80 (09/11 0435) Resp:  [16-18] 18 (09/11 0435) BP: (100-128)/(46-60) 128/60 mmHg (09/11 0435) SpO2:  [97 %-100 %] 98 % (09/11 0435) Last BM Date: 12/30/12  Intake/Output from previous day: 09/10 0701 - 09/11 0700 In: 240 [P.O.:240] Out: -  Intake/Output this shift:    PE: General-- alert and talkative no acute distress Heart-- Lungs--clear Abdomen-- soft and nontender  Lab Results:  Recent Labs  12/28/12 2056 12/29/12 0351 12/29/12 0955 12/29/12 1902 12/29/12 2210 12/30/12 0800 12/31/12 0414  WBC 14.3* 9.0 7.4  --   --   --  7.0  HGB 13.2 10.8*  10.8* 10.0* 7.5* 7.4* 9.4* 8.0*  HCT 39.7 32.0*  32.6* 30.0* 22.4* 22.4* 27.2* 22.9*  PLT 277 242 209  --   --   --  146*   BMET  Recent Labs  12/28/12 2056 12/29/12 0351  NA 140 140  K 5.3* 4.4  CL 103 106  CO2 24 27  CREATININE 0.51 0.62   LFT No results found for this basename: PROT, AST, ALT, ALKPHOS, BILITOT, BILIDIR, IBILI,  in the last 72 hours PT/INR No results found for this basename: LABPROT, INR,  in the last 72 hours PANCREAS No results found for this basename: LIPASE,  in the last 72 hours       Studies/Results: No results found.  Medications: I have reviewed the patient's current medications.  Assessment/Plan: 1. Bleeding Duodenal Ulcer. Probably NSAID related. Patient probably has stop bleeding although her hemoglobin this morning did drop a little. Would keep her a full liquid's at least one more day and continue protonix and carafate. We'll go ahead and add Miralax and try to clear the old blood from her G.I. track.   Kaidon Kinker JR,Hiyab Nhem L 12/31/2012, 9:22  AM

## 2013-01-01 LAB — IRON AND TIBC
Iron: 24 ug/dL — ABNORMAL LOW (ref 42–135)
Saturation Ratios: 8 % — ABNORMAL LOW (ref 20–55)
UIBC: 280 ug/dL (ref 125–400)

## 2013-01-01 LAB — CBC
Hemoglobin: 7.8 g/dL — ABNORMAL LOW (ref 12.0–15.0)
MCH: 29.3 pg (ref 26.0–34.0)
MCV: 88.3 fL (ref 78.0–100.0)
RBC: 2.66 MIL/uL — ABNORMAL LOW (ref 3.87–5.11)
WBC: 6.5 10*3/uL (ref 4.0–10.5)

## 2013-01-01 LAB — FERRITIN: Ferritin: 35 ng/mL (ref 10–291)

## 2013-01-01 MED ORDER — SIMETHICONE 80 MG PO CHEW
80.0000 mg | CHEWABLE_TABLET | Freq: Four times a day (QID) | ORAL | Status: DC | PRN
  Administered 2013-01-01: 80 mg via ORAL
  Filled 2013-01-01: qty 1

## 2013-01-01 MED ORDER — PANTOPRAZOLE SODIUM 40 MG PO TBEC
40.0000 mg | DELAYED_RELEASE_TABLET | Freq: Two times a day (BID) | ORAL | Status: DC
  Administered 2013-01-01 – 2013-01-02 (×2): 40 mg via ORAL
  Filled 2013-01-01 (×3): qty 1

## 2013-01-01 NOTE — Progress Notes (Signed)
[facility] PROGRESS NOTE Subjective No gross bleeding stools still dark but clearing  Objective: Vital signs in last 24 hours: Temp:  [97.8 F (36.6 C)-98.4 F (36.9 C)] 97.8 F (36.6 C) (09/12 0533) Pulse Rate:  [71-97] 71 (09/12 0533) Resp:  [18-20] 18 (09/12 0533) BP: (109-135)/(53-62) 118/58 mmHg (09/12 0533) SpO2:  [96 %-98 %] 96 % (09/12 0533) Last BM Date: 12/31/12  Intake/Output from previous day: 09/11 0701 - 09/12 0700 In: 120 [P.O.:120] Out: -  Intake/Output this shift:    PE: General--no distress  Abdomen--soft nontender  Lab Results:  Recent Labs  12/29/12 0955 12/29/12 1902 12/29/12 2210 12/30/12 0800 12/31/12 0414 01/01/13 0430  WBC 7.4  --   --   --  7.0 6.5  HGB 10.0* 7.5* 7.4* 9.4* 8.0* 7.8*  HCT 30.0* 22.4* 22.4* 27.2* 22.9* 23.5*  PLT 209  --   --   --  146* 165   BMET No results found for this basename: NA, K, CL, CO2, CREATININE,  in the last 72 hours LFT No results found for this basename: PROT, AST, ALT, ALKPHOS, BILITOT, BILIDIR, IBILI,  in the last 72 hours PT/INR No results found for this basename: LABPROT, INR,  in the last 72 hours PANCREAS No results found for this basename: LIPASE,  in the last 72 hours       Studies/Results: No results found.  Medications: I have reviewed the patient's current medications.  Assessment/Plan: 1. Bleeding DU. 2 days s/p clipping. Hg about the same suspect has stopped. Will advance diet.   Spring San JR,Flora Ratz L 01/01/2013, 7:26 AM

## 2013-01-01 NOTE — Progress Notes (Signed)
[facility] PROGRESS NOTE  Ashley Chambers:[id number] DOB: May 27, 1930 DOA: 12/28/2012 PCP: No primary provider on file.  Assessment/Plan: Nausea/vomiting -Now  resolved -most likely secondary to dyspepsia due to PUD -will advance diet to low fiber diet -IVF's changed to NSL now. -add simethicone for bloating sensation.  Acute blood loss anemia -secondary to duodenal ulcer; most likely induce from NSAID's use -s/p epinephrine injection and clipping by GI (12/30/12)  -will hold ASA at least for 2 weeks  And will recommend no further use of NSAID's -appreciate GI consult, assistance and rec's -change protonix to PO -will follow Hgb trend; has received 4 units of PRBC's total -Hgb this morning is 7.8; patient had some black stools overnight(but could be old blood seen during EGD, especially as Hgb remains stable) -will repeat CBC in am -Pharmacy for IV iron    High cholesterol -continue statin therapy    Hyperkalemia -potassium now WNL  Code Status: full code Family Communication: husband an son at the bedside Disposition Plan: home when stable with home health services.  Consultants:  Gastroenterology Deboraha Sprang)  Procedures:  EGD (positive acutely bleeding duodenal ulcer)  Antibiotics:  None   HPI/Subjective: NAD. Pt is alert, follows commands appropriately, is feeling better. Still with some black stools and dyspepsia, but much better overall.  Objective: Filed Vitals:   12/31/12 1333 12/31/12 2206 01/01/13 0533 01/01/13 1401  BP: 109/53 135/62 118/58 127/62  Pulse: 97 97 71 79  Temp: 98.4 F (36.9 C) 98.1 F (36.7 C) 97.8 F (36.6 C) 98.2 F (36.8 C)  TempSrc: Oral Oral Oral Oral  Resp: 20 18 18 18   Height:      Weight:      SpO2: 98% 97% 96% 97%    Intake/Output Summary (Last 24 hours) at 01/01/13 1809 Last data filed at 01/01/13 1401  Gross per 24 hour  Intake    483 ml  Output      0 ml  Net    483 ml    Exam:   General:  Pt is  alert, follows commands appropriately, not in acute distress; is feeling better. Still with some black stools and dyspepsia, but much better overall.  Cardiovascular: Regular rate and rhythm, S1/S2, no murmurs, no rubs, no gallops  Respiratory: Clear to auscultation bilaterally, no wheezing, no crackles, no rhonchi  Abdomen: Soft, non tender, non distended, bowel sounds present, no guarding  Extremities: No edema, pulses DP and PT palpable bilaterally  Neuro: Grossly nonfocal  Data Reviewed: Basic Metabolic Panel:  Recent Labs Lab 12/28/12 2056 12/29/12 0351  NA 140 140  K 5.3* 4.4  CL 103 106  CO2 24 27  GLUCOSE 169* 132*  BUN 43* 35*  CREATININE 0.51 0.62  CALCIUM 9.4 8.7   CBC:  Recent Labs Lab 12/28/12 2056 12/29/12 0351 12/29/12 0955 12/29/12 1902 12/29/12 2210 12/30/12 0800 12/31/12 0414 01/01/13 0430  WBC 14.3* 9.0 7.4  --   --   --  7.0 6.5  NEUTROABS 12.8*  --   --   --   --   --   --   --   HGB 13.2 10.8*  10.8* 10.0* 7.5* 7.4* 9.4* 8.0* 7.8*  HCT 39.7 32.0*  32.6* 30.0* 22.4* 22.4* 27.2* 22.9* 23.5*  MCV 90.4 91.1 90.9  --   --   --  87.7 88.3  PLT 277 242 209  --   --   --  146* 165   Studies: No results found.  Scheduled Meds: .  atorvastatin  20 mg Oral q1800  . pantoprazole  40 mg Oral BID  . polyethylene glycol  17 g Oral TID  . sodium chloride  3 mL Intravenous Q12H  . sucralfate  1 g Oral TID AC & HS  . zolpidem  5 mg Oral QHS   Continuous Infusions:    Ebrima Ranta, MD  [facility] Pager [phone]  If 7PM-7AM, please contact night-coverage [email or web address] Password [email or web address] 01/01/2013, 6:09 PM   LOS: 4 days

## 2013-01-01 NOTE — Progress Notes (Signed)
Physical Therapy Treatment Patient Details Name: Ashley Chambers MRN: [id number] DOB: Jul 31, 1930 Today's Date: 01/01/2013 Time: 4098-1191 PT Time Calculation (min): 18 min  PT Assessment / Plan / Recommendation  History of Present Illness 79 y.o. female admitted with nausea, vomiting, diarrhea, heme positive stool.    PT Comments   Pt ambulated in hall today but is very weak and requires assistance to ambulate. Encouraged pt to consider HHPT as it is recommended.   Follow Up Recommendations  Home health PT (recommended to pt that HHPT would be beneficial, she wants t)     Does the patient have the potential to tolerate intense rehabilitation     Barriers to Discharge        Equipment Recommendations  None recommended by PT    Recommendations for Other Services    Frequency Min 3X/week   Progress towards PT Goals Progress towards PT goals: Progressing toward goals  Plan Current plan remains appropriate    Precautions / Restrictions Precautions Precautions: Fall Precaution Comments: very weak.   Pertinent Vitals/Pain Sats while ambulating 100%, HR 96 BP after completing ambulation=142/48     Mobility  Bed Mobility Bed Mobility: Supine to Sit Supine to Sit: 6: Modified independent (Device/Increase time);With rails Transfers Transfers: Sit to Stand;Stand to Sit Sit to Stand: 4: Min assist;From bed Stand to Sit: To chair/3-in-1;To bed;5: Supervision Details for Transfer Assistance: Pt asked for assistance to stand and steady Ambulation/Gait Ambulation/Gait Assistance: 4: Min assist Ambulation Distance (Feet): 280 Feet (then 120) Assistive device: 1 person hand held assist Ambulation/Gait Assistance Details: pt is unsteady without external support. Pt declined use of RW. Pt had to sit down to rest while in hallway due to  c/o fatigue. Gait Pattern: Step-through pattern Gait velocity: decreased.    Exercises General Exercises - Lower Extremity Long Arc Quad:  AROM;Both;10 reps;Seated Hip ABduction/ADduction: Both;AROM;5 reps;Seated Hip Flexion/Marching: AROM;Both;10 reps;Seated   PT Diagnosis:    PT Problem List:   PT Treatment Interventions:     PT Goals (current goals can now be found in the care plan section)    Visit Information  Last PT Received On: 01/01/13 Assistance Needed: +1 History of Present Illness: 79 y.o. female admitted with nausea, vomiting, diarrhea, heme positive stool.     Subjective Data      Cognition  Cognition Arousal/Alertness: Awake/alert Behavior During Therapy: WFL for tasks assessed/performed Overall Cognitive Status: Within Functional Limits for tasks assessed    Balance  Static Sitting Balance Static Sitting - Balance Support: No upper extremity supported Static Sitting - Level of Assistance: 5: Stand by assistance  End of Session PT - End of Session Activity Tolerance: Patient limited by fatigue;Patient tolerated treatment well Patient left: in chair;with call bell/phone within reach;with family/visitor present Nurse Communication: Mobility status   GP     Rada Hay 01/01/2013, 10:57 AM Blanchard Kelch PT [phone]

## 2013-01-02 ENCOUNTER — Inpatient Hospital Stay (HOSPITAL_COMMUNITY)
Admission: EM | Admit: 2013-01-02 | Discharge: 2013-01-04 | DRG: 384 | Disposition: A | Discharge status: Home Health | Payer: Medicare Other | Attending: Internal Medicine | Admitting: Internal Medicine

## 2013-01-02 ENCOUNTER — Encounter (HOSPITAL_COMMUNITY): Payer: Self-pay | Admitting: *Deleted

## 2013-01-02 DIAGNOSIS — R5381 Other malaise: Secondary | ICD-10-CM

## 2013-01-02 DIAGNOSIS — K5730 Diverticulosis of large intestine without perforation or abscess without bleeding: Secondary | ICD-10-CM | POA: Diagnosis present

## 2013-01-02 DIAGNOSIS — K922 Gastrointestinal hemorrhage, unspecified: Secondary | ICD-10-CM

## 2013-01-02 DIAGNOSIS — E7800 Pure hypercholesterolemia, unspecified: Secondary | ICD-10-CM | POA: Diagnosis present

## 2013-01-02 DIAGNOSIS — D649 Anemia, unspecified: Secondary | ICD-10-CM

## 2013-01-02 DIAGNOSIS — K648 Other hemorrhoids: Secondary | ICD-10-CM | POA: Diagnosis present

## 2013-01-02 DIAGNOSIS — R112 Nausea with vomiting, unspecified: Secondary | ICD-10-CM | POA: Diagnosis present

## 2013-01-02 DIAGNOSIS — T3995XA Adverse effect of unspecified nonopioid analgesic, antipyretic and antirheumatic, initial encounter: Secondary | ICD-10-CM | POA: Diagnosis present

## 2013-01-02 DIAGNOSIS — K644 Residual hemorrhoidal skin tags: Secondary | ICD-10-CM | POA: Diagnosis present

## 2013-01-02 DIAGNOSIS — K269 Duodenal ulcer, unspecified as acute or chronic, without hemorrhage or perforation: Principal | ICD-10-CM | POA: Diagnosis present

## 2013-01-02 DIAGNOSIS — G589 Mononeuropathy, unspecified: Secondary | ICD-10-CM | POA: Diagnosis present

## 2013-01-02 DIAGNOSIS — D62 Acute posthemorrhagic anemia: Secondary | ICD-10-CM | POA: Diagnosis present

## 2013-01-02 DIAGNOSIS — Z79899 Other long term (current) drug therapy: Secondary | ICD-10-CM

## 2013-01-02 DIAGNOSIS — K449 Diaphragmatic hernia without obstruction or gangrene: Secondary | ICD-10-CM | POA: Diagnosis present

## 2013-01-02 DIAGNOSIS — E079 Disorder of thyroid, unspecified: Secondary | ICD-10-CM | POA: Diagnosis present

## 2013-01-02 DIAGNOSIS — K3189 Other diseases of stomach and duodenum: Secondary | ICD-10-CM

## 2013-01-02 DIAGNOSIS — K921 Melena: Secondary | ICD-10-CM

## 2013-01-02 DIAGNOSIS — R197 Diarrhea, unspecified: Secondary | ICD-10-CM | POA: Diagnosis present

## 2013-01-02 DIAGNOSIS — M4800 Spinal stenosis, site unspecified: Secondary | ICD-10-CM | POA: Diagnosis present

## 2013-01-02 LAB — COMPREHENSIVE METABOLIC PANEL
Alkaline Phosphatase: 39 U/L (ref 39–117)
BUN: 12 mg/dL (ref 6–23)
Chloride: 105 mEq/L (ref 96–112)
GFR calc Af Amer: >90 mL/min (ref >90)
GFR calc non Af Amer: 84 mL/min — ABNORMAL LOW (ref >90)
Glucose, Bld: 119 mg/dL — ABNORMAL HIGH (ref 70–99)
Potassium: 4.1 mEq/L (ref 3.5–5.1)
Total Bilirubin: 0.2 mg/dL — ABNORMAL LOW (ref 0.3–1.2)

## 2013-01-02 LAB — CBC
MCH: 30.3 pg (ref 26.0–34.0)
MCHC: 33.3 g/dL (ref 30.0–36.0)
Platelets: 179 10*3/uL (ref 150–400)
RBC: 2.64 MIL/uL — ABNORMAL LOW (ref 3.87–5.11)
RBC: 2.94 MIL/uL — ABNORMAL LOW (ref 3.87–5.11)
RDW: 14.3 % (ref 11.5–15.5)
WBC: 5.9 10*3/uL (ref 4.0–10.5)

## 2013-01-02 LAB — TSH: TSH: 1.809 u[IU]/mL (ref 0.350–4.500)

## 2013-01-02 MED ORDER — POLYETHYLENE GLYCOL 3350 17 G PO PACK: 17.0000 g | PACK | Freq: Every day | ORAL | Status: DC

## 2013-01-02 MED ORDER — PREGABALIN 25 MG PO CAPS: 25.0000 mg | ORAL_CAPSULE | Freq: Every day | ORAL | Status: DC

## 2013-01-02 MED ORDER — PANTOPRAZOLE SODIUM 40 MG IV SOLR
40.0000 mg | Freq: Once | INTRAVENOUS | Status: AC
  Administered 2013-01-02: 40 mg via INTRAVENOUS
  Filled 2013-01-02: qty 40

## 2013-01-02 MED ORDER — ONDANSETRON HCL 4 MG PO TABS: 4.0000 mg | ORAL_TABLET | Freq: Four times a day (QID) | ORAL | Status: DC | PRN

## 2013-01-02 MED ORDER — ZOLPIDEM TARTRATE 5 MG PO TABS
5.0000 mg | ORAL_TABLET | Freq: Every evening | ORAL | Status: DC | PRN
Start: 2013-01-02 — End: 2013-01-04
  Administered 2013-01-02 – 2013-01-03 (×2): 5 mg via ORAL
  Filled 2013-01-02 (×2): qty 1

## 2013-01-02 MED ORDER — SODIUM CHLORIDE 0.9 % IJ SOLN
3.0000 mL | Freq: Two times a day (BID) | INTRAMUSCULAR | Status: DC
  Administered 2013-01-02 – 2013-01-03 (×2): 3 mL via INTRAVENOUS

## 2013-01-02 MED ORDER — SODIUM CHLORIDE 0.9 % IV BOLUS (SEPSIS)
500.0000 mL | Freq: Once | INTRAVENOUS | Status: AC
  Administered 2013-01-02: 500 mL via INTRAVENOUS

## 2013-01-02 MED ORDER — SODIUM CHLORIDE 0.9 % IV SOLN
1020.0000 mg | Freq: Once | INTRAVENOUS | Status: AC
  Administered 2013-01-02: 1020 mg via INTRAVENOUS
  Filled 2013-01-02: qty 34

## 2013-01-02 MED ORDER — COENZYME Q10 30 MG PO CAPS: 30.0000 mg | ORAL_CAPSULE | Freq: Three times a day (TID) | ORAL | Status: DC

## 2013-01-02 MED ORDER — ONDANSETRON HCL 4 MG/2ML IJ SOLN
4.0000 mg | Freq: Four times a day (QID) | INTRAMUSCULAR | Status: DC | PRN
  Administered 2013-01-03: 4 mg via INTRAVENOUS
  Filled 2013-01-02: qty 2

## 2013-01-02 MED ORDER — SODIUM CHLORIDE 0.9 % IV SOLN
INTRAVENOUS | Status: DC
  Administered 2013-01-02: 21:00:00 via INTRAVENOUS
  Administered 2013-01-03: 75 mL/h via INTRAVENOUS
  Administered 2013-01-04: 11:00:00 via INTRAVENOUS

## 2013-01-02 MED ORDER — PANTOPRAZOLE SODIUM 40 MG PO TBEC: 40.0000 mg | DELAYED_RELEASE_TABLET | Freq: Two times a day (BID) | ORAL | Status: DC

## 2013-01-02 MED ORDER — SUCRALFATE 1 G PO TABS: 1.0000 g | ORAL_TABLET | Freq: Three times a day (TID) | ORAL | Status: DC

## 2013-01-02 MED ORDER — TRAMADOL HCL 50 MG PO TABS: 50.0000 mg | ORAL_TABLET | Freq: Four times a day (QID) | ORAL | Status: DC | PRN

## 2013-01-02 MED ORDER — PANTOPRAZOLE SODIUM 40 MG IV SOLR
40.0000 mg | Freq: Two times a day (BID) | INTRAVENOUS | Status: DC
  Administered 2013-01-03 – 2013-01-04 (×3): 40 mg via INTRAVENOUS
  Filled 2013-01-02 (×4): qty 40

## 2013-01-02 MED ORDER — FERROUS FUMARATE-DSS PO TABS: 1.0000 | ORAL_TABLET | Freq: Two times a day (BID) | ORAL | Status: DC

## 2013-01-02 NOTE — Progress Notes (Signed)
Patient called back after discharge concerned after passing some gas and then wiping and finding dark blood on the tissue paper. Notified Dr. Gwenlyn Perking and then called to reassure the patient and determine the color and amount of blood.  Advised patient that if she has continued bleeding over the next few hours to come back to the emergency room. However, patient was very anxious and difficult to reassure. Notified Dr. Gwenlyn Perking and have asked him to call patient. Erskin Burnet RN

## 2013-01-02 NOTE — Progress Notes (Signed)
   CARE MANAGEMENT NOTE 01/02/2013  Patient:  Ashley Chambers, Ashley Chambers   Account Number:  [id number]  Date Initiated:  12/29/2012  Documentation initiated by:  McGIBBONEY,COOKIE  Subjective/Objective Assessment:   pt admitted with cco diarrhea, N, V,     Action/Plan:   from home   Anticipated DC Date:  12/31/2012   Anticipated DC Plan:  HOME W HOME HEALTH SERVICES      DC Planning Services  CM consult      [facility] Choice  HOME HEALTH   Choice offered to / List presented to:  C-1 Patient        HH arranged  HH-2 PT  HH-3 OT      [facility] [place] agency  [facility]   Status of service:  Completed, signed off Medicare Important Message given?   (If response is "NO", the following Medicare IM given date fields will be blank) Date Medicare IM given:   Date Additional Medicare IM given:    Discharge Disposition:  HOME W HOME HEALTH SERVICES  Per UR Regulation:  Reviewed for med. necessity/level of care/duration of stay  If discussed at Long Length of Stay Meetings, dates discussed:    Comments:  01/02/2013 1300 NCM spoke to pt and offered choice for [facility]. [facility] requested for [facility]. Notiifed [facility] of pt's scheduled dc home today needing HHPT/OT. Isidoro Donning RN CCM Case Mgmt phone [phone]  9/9/ 14 MMcGibboney, RN, BSN Chart reviewed.

## 2013-01-02 NOTE — Progress Notes (Signed)
MEDICATION RELATED CONSULT NOTE - INITIAL   Pharmacy Consult for IV iron  Indication: iron deficiency  Allergies  Allergen Reactions  . Sulfa Antibiotics Other (See Comments)    unknown    Patient Measurements: Height: 5\' 6"  (167.6 cm) Weight: 123 lb 7.3 oz (56 kg) IBW/kg (Calculated) : 59.3   Vital Signs: Temp: 98 F (36.7 C) (09/13 0449) Temp src: Oral (09/13 0449) BP: 123/60 mmHg (09/13 0449) Pulse Rate: 75 (09/13 0449) Intake/Output from previous day: 09/12 0701 - 09/13 0700 In: 483 [P.O.:480; I.V.:3] Out: -   Labs:  Recent Labs  12/31/12 0414 01/01/13 0430 01/02/13 0556  WBC 7.0 6.5 5.9  HGB 8.0* 7.8* 8.0*  HCT 22.9* 23.5* 23.5*  PLT 146* 165 179   Estimated Creatinine Clearance: 50.4 ml/min (by C-G formula based on Cr of 0.62).   Medical History: Past Medical History  Diagnosis Date  . High cholesterol   . Thyroid disease     Assessment: 15 yoF admitted 9/8 for evaluation of nausea, vomiting and diarrhea for 24 hours prior to admission, pt also noted to have bright red blood per rectum as well as dark stool with associated acute blood loss anemia. Patient was found to have bleeding gastric and duodenal ulcers s/p epinephrine injection and clipping by GI (12/30/12). NSAIDs have been held and the patient is s/p 4 total units of PRBC this admission, none since 9/10.  Pharmacy was consulted to dose IV iron and anemia panel was obtained to ensure iron deficiency since hemoglobin drop likely caused by GIB.  Hgb appears to have stabilized at 8.0  Iron level low at 24, saturation ratio low at 8  Ferritin at low end of normal at 35  Patient is not on iron supplementation at home    Goal of Therapy:  Full repletion of iron stores  Plan:  - give Feraheme 1020mg  x 1 today - recommend outpatient follow-up of iron deficiency  Thank you for the consult.  Tomi Bamberger, PharmD Clinical Pharmacist Pager: [phone] Pharmacy: [phone] 01/02/2013  7:57 AM

## 2013-01-02 NOTE — ED Provider Notes (Signed)
CSN: [id number]     Arrival date & time 01/02/13  1725 History   First MD Initiated Contact with Patient 01/02/13 1747     Chief Complaint  Patient presents with  . GI Bleeding   (Consider location/radiation/quality/duration/timing/severity/associated sxs/prior Treatment) The history is provided by the patient and a relative.  pt s/p discharge from hospital earlier today after admission w gi bleed due to duodenal ulcer, c/o recurrent rectal bleeding, both brighter read and dark maroon since d/c from hospital. Feels mildly lightheaded when stands. No abd pain. No cp or sob. States stools had cleared in hospital but now w recurrent rectal bleeding.      Past Medical History  Diagnosis Date  . High cholesterol   . Thyroid disease    Past Surgical History  Procedure Laterality Date  . Hernia repair    . Tonsillectomy    . Esophagogastroduodenoscopy N/A 12/30/2012    Procedure: ESOPHAGOGASTRODUODENOSCOPY (EGD);  Surgeon: Vertell Novak., MD;  Location: [facility];  Service: Endoscopy;  Laterality: N/A;  . Flexible sigmoidoscopy N/A 12/30/2012    Procedure: FLEXIBLE SIGMOIDOSCOPY;  Surgeon: Vertell Novak., MD;  Location: [facility];  Service: Endoscopy;  Laterality: N/A;   No family history on file. History  Substance Use Topics  . Smoking status: Never Smoker   . Smokeless tobacco: Not on file  . Alcohol Use: No   OB History   Grav Para Term Preterm Abortions TAB SAB Ect Mult Living                 Review of Systems  Constitutional: Negative for fever and chills.  HENT: Negative for neck pain.   Eyes: Negative for redness.  Respiratory: Negative for shortness of breath.   Cardiovascular: Negative for chest pain.  Gastrointestinal: Positive for blood in stool. Negative for abdominal pain.  Genitourinary: Negative for flank pain.  Musculoskeletal: Negative for back pain.  Skin: Negative for rash.  Neurological: Negative for headaches.  Hematological: Does not  bruise/bleed easily.  Psychiatric/Behavioral: Negative for confusion.    Allergies  Sulfa antibiotics  Home Medications   Current Outpatient Rx  Name  Route  Sig  Dispense  Refill  . alendronate (FOSAMAX) 70 MG tablet   Oral   Take 70 mg by mouth every 7 (seven) days. Take with a full glass of water on an empty stomach.         . Cholecalciferol (VITAMIN D3) 400 UNITS CAPS   Oral   Take 1 capsule by mouth daily.         Marland Kitchen co-enzyme Q-10 30 MG capsule   Oral   Take 1 capsule (30 mg total) by mouth 3 (three) times daily. Hold until follow up with PCP         . DiphenhydrAMINE HCl (ALLERGY MED PO)   Oral   Take 2 tablets by mouth daily.         . Ferrous Fumarate-DSS TABS   Oral   Take 1 tablet by mouth 2 (two) times daily.   60 tablet   1   . Multiple Vitamins-Minerals (CENTRUM SILVER PO)   Oral   Take 1 tablet by mouth daily.         . Naphazoline HCl (CLEAR EYES OP)   Both Eyes   Place 2 drops into both eyes as needed (dry eyes).          . pantoprazole (PROTONIX) 40 MG tablet   Oral   Take 1  tablet (40 mg total) by mouth 2 (two) times daily.   60 tablet   1   . polyethylene glycol (MIRALAX / GLYCOLAX) packet   Oral   Take 17 g by mouth daily.   30 each   0   . pregabalin (LYRICA) 25 MG capsule   Oral   Take 1 capsule (25 mg total) by mouth at bedtime.   30 capsule   1   . rosuvastatin (CRESTOR) 10 MG tablet   Oral   Take 10 mg by mouth daily.         . sucralfate (CARAFATE) 1 G tablet   Oral   Take 1 tablet (1 g total) by mouth 3 (three) times daily.   90 tablet   1   . traMADol (ULTRAM) 50 MG tablet   Oral   Take 1 tablet (50 mg total) by mouth every 6 (six) hours as needed for pain.   40 tablet   0   . zolpidem (AMBIEN) 5 MG tablet   Oral   Take 5 mg by mouth at bedtime.          BP 168/62  Pulse 88  Temp(Src) 98.2 F (36.8 C) (Oral)  Resp 18  SpO2 100% Physical Exam  Nursing note and vitals  reviewed. Constitutional: She appears well-developed and well-nourished. No distress.  HENT:  Mouth/Throat: Oropharynx is clear and moist.  Eyes: No scleral icterus.  Neck: Neck supple. No tracheal deviation present.  Cardiovascular: Normal rate, regular rhythm, normal heart sounds and intact distal pulses.   Pulmonary/Chest: Effort normal and breath sounds normal. No respiratory distress.  Abdominal: Soft. Normal appearance. She exhibits no distension. There is no tenderness.  Genitourinary:  Melena, heme pos.   Musculoskeletal: She exhibits no edema and no tenderness.  Neurological: She is alert.  Skin: Skin is warm and dry. No rash noted.  Psychiatric: She has a normal mood and affect.    ED Course  Procedures (including critical care time)   Results for orders placed during the hospital encounter of 01/02/13  CBC      Result Value Range   WBC 6.9  4.0 - 10.5 K/uL   RBC 2.94 (*) 3.87 - 5.11 MIL/uL   Hemoglobin 8.8 (*) 12.0 - 15.0 g/dL   HCT 40.9 (*) 81.1 - 91.4 %   MCV 89.8  78.0 - 100.0 fL   MCH 29.9  26.0 - 34.0 pg   MCHC 33.3  30.0 - 36.0 g/dL   RDW 78.2  95.6 - 21.3 %   Platelets 198  150 - 400 K/uL  COMPREHENSIVE METABOLIC PANEL      Result Value Range   Sodium 141  135 - 145 mEq/L   Potassium 4.1  3.5 - 5.1 mEq/L   Chloride 105  96 - 112 mEq/L   CO2 26  19 - 32 mEq/L   Glucose, Bld 119 (*) 70 - 99 mg/dL   BUN 12  6 - 23 mg/dL   Creatinine, Ser 0.86  0.50 - 1.10 mg/dL   Calcium 9.6  8.4 - 57.8 mg/dL   Total Protein 5.9 (*) 6.0 - 8.3 g/dL   Albumin 3.3 (*) 3.5 - 5.2 g/dL   AST 24  0 - 37 U/L   ALT 24  0 - 35 U/L   Alkaline Phosphatase 39  39 - 117 U/L   Total Bilirubin 0.2 (*) 0.3 - 1.2 mg/dL   GFR calc non Af Amer 84 (*) >90 mL/min  GFR calc Af Amer >90  >90 mL/min      MDM  Iv ns bolus. Labs.  Reviewed nursing notes and prior charts for additional history.   hospitalist called.   hgb stable for prior, pt w another bloody bm in ed, dark red.    abd soft nt. Vitals stable. Will admit to obs, plan recheck hgb in am.      Suzi Roots, MD 01/02/13 [phone]

## 2013-01-02 NOTE — ED Notes (Signed)
Pt was just discharged from hospital for dehydration and bleeding ulcers. Had EGD done, once pt came home pt starting bleeding again from rectum. Blood is bright red. Dr. Loa Socks aware that pt is at ED, wants staff to page 4th floor so he can evaluate her.

## 2013-01-02 NOTE — H&P (Signed)
[facility] History and Physical  Ashley Chambers ZOX:[id number] DOB: 05-07-1930 DOA: 01/02/2013  Referring physician: Dr. Denton Chambers PCP: No primary provider on file.  Specialists: GI, Ashley Chambers  Chief Complaint: bright red blood per rectum  HPI: Ashley Chambers is a 79 y.o. female just discharged today from the hospital status post a GI bleed due to a duodenal ulcer, she had EGD done about 3 days ago, and she required 4 units of packed RBCs while hospitalized. Her hemoglobin has been stable following the EGD, and there was no further evidence of bleeding, and patient was discharge home today. She presents to the ED again today stating that she has had 4 episodes of bright red blood per rectum at home. In the emergency room, her hemoglobin was 8.8 which is in fact better than the one this morning. She had another bloody bowel movement in the emergency room. She endorses mild lightheadedness when she stands up. She appreciates her symptoms were much more severe last time she was admitted. Hospitalist service was asked for admission. Patient denies any chest pain, denies any shortness of breath, she has no abdominal pain. She denies any nausea or vomiting.  Review of Systems: as per history of present illness otherwise negative  Past Medical History  Diagnosis Date  . High cholesterol   . Thyroid disease    Past Surgical History  Procedure Laterality Date  . Hernia repair    . Tonsillectomy    . Esophagogastroduodenoscopy N/A 12/30/2012    Procedure: ESOPHAGOGASTRODUODENOSCOPY (EGD);  Surgeon: Ashley Novak., MD;  Location: [facility];  Service: Endoscopy;  Laterality: N/A;  . Flexible sigmoidoscopy N/A 12/30/2012    Procedure: FLEXIBLE SIGMOIDOSCOPY;  Surgeon: Ashley Novak., MD;  Location: [facility];  Service: Endoscopy;  Laterality: N/A;   Social History:  reports that she has never smoked. She does not have any smokeless tobacco history on file. She reports that she  does not drink alcohol or use illicit drugs.  Allergies  Allergen Reactions  . Nsaids     Gi bleeding  . Sulfa Antibiotics Other (See Comments)    unknown    Family history noncontributory  Prior to Admission medications   Medication Sig Start Date End Date Taking? Authorizing Provider  alendronate (FOSAMAX) 70 MG tablet Take 70 mg by mouth every 7 (seven) days. Take with a full glass of water on an empty stomach.   Yes Historical Provider, MD  Cholecalciferol (VITAMIN D3) 400 UNITS CAPS Take 1 capsule by mouth daily.   Yes Historical Provider, MD  co-enzyme Q-10 30 MG capsule Take 1 capsule (30 mg total) by mouth 3 (three) times daily. Hold until follow up with PCP 01/02/13  Yes Vassie Loll, MD  Multiple Vitamins-Minerals (CENTRUM SILVER PO) Take 1 tablet by mouth daily.   Yes Historical Provider, MD  Ferrous Fumarate-DSS TABS Take 1 tablet by mouth 2 (two) times daily. 01/02/13   Vassie Loll, MD  Naphazoline HCl (CLEAR EYES OP) Place 2 drops into both eyes as needed (dry eyes).     Historical Provider, MD  pantoprazole (PROTONIX) 40 MG tablet Take 1 tablet (40 mg total) by mouth 2 (two) times daily. 01/02/13   Vassie Loll, MD  polyethylene glycol [facility] / GLYCOLAX) packet Take 17 g by mouth daily. 01/02/13   Vassie Loll, MD  pregabalin (LYRICA) 25 MG capsule Take 1 capsule (25 mg total) by mouth at bedtime. 01/02/13   Vassie Loll, MD  rosuvastatin (CRESTOR) 10 MG  tablet Take 10 mg by mouth daily.    Historical Provider, MD  sucralfate (CARAFATE) 1 G tablet Take 1 tablet (1 g total) by mouth 3 (three) times daily. 01/02/13   Vassie Loll, MD  traMADol (ULTRAM) 50 MG tablet Take 1 tablet (50 mg total) by mouth every 6 (six) hours as needed for pain. 01/02/13   Vassie Loll, MD  zolpidem (AMBIEN) 5 MG tablet Take 5 mg by mouth at bedtime.    Historical Provider, MD   Physical Exam: Filed Vitals:   01/02/13 1942 01/02/13 2047 01/02/13 2048 01/02/13 2049  BP: 176/70 152/60 167/67  162/74  Pulse: 92 83 90 101  Temp:  98.4 F (36.9 C)    TempSrc:  Oral    Resp:  18    Height:  5\' 6"  (1.676 m)    Weight:  55.8 kg (123 lb 0.3 oz)    SpO2:  96%       General:  No apparent distress  Eyes: PERRL, EOMI, no scleral icterus  ENT: moist oropharynx  Neck: supple, no JVD  Cardiovascular: regular rate without MRG; 2+ peripheral pulses  Respiratory: CTA biL, good air movement without wheezing, rhonchi or crackled  Abdomen: soft, non tender to palpation, positive bowel sounds, no guarding, no rebound  Skin: no rashes  Musculoskeletal: no peripheral edema  Psychiatric: normal mood and affect  Neurologic: nonfocal  Labs on Admission:  Basic Metabolic Panel:  Recent Labs Lab 12/28/12 2056 12/29/12 0351 01/02/13 1815  NA 140 140 141  K 5.3* 4.4 4.1  CL 103 106 105  CO2 24 27 26   GLUCOSE 169* 132* 119*  BUN 43* 35* 12  CREATININE 0.51 0.62 0.61  CALCIUM 9.4 8.7 9.6   Liver Function Tests:  Recent Labs Lab 01/02/13 1815  AST 24  ALT 24  ALKPHOS 39  BILITOT 0.2*  PROT 5.9*  ALBUMIN 3.3*   CBC:  Recent Labs Lab 12/28/12 2056  12/29/12 0955  12/30/12 0800 12/31/12 0414 01/01/13 0430 01/02/13 0556 01/02/13 1815  WBC 14.3*  < > 7.4  --   --  7.0 6.5 5.9 6.9  NEUTROABS 12.8*  --   --   --   --   --   --   --   --   HGB 13.2  < > 10.0*  < > 9.4* 8.0* 7.8* 8.0* 8.8*  HCT 39.7  < > 30.0*  < > 27.2* 22.9* 23.5* 23.5* 26.4*  MCV 90.4  < > 90.9  --   --  87.7 88.3 89.0 89.8  PLT 277  < > 209  --   --  146* 165 179 198  < > = values in this interval not displayed.  EKG: Independently reviewed.  Assessment/Plan Active Problems:   GIB (gastrointestinal bleeding)   Nausea & vomiting   Diarrhea   Acute blood loss anemia  GI bleed - patient with evidence of further bloody bowel movements. - heart rate 88 in the ED, admit to telemetry floor. Check orthostatics. - Repeat hemoglobin at midnight and otherwise were morning. - Discussed case  with Ashley Chambers, will make patient n.p.o. after midnight in case hemoglobin further decreases and an EGD as needed   Code Status: full code  Family Communication: daughter in the room  Disposition Plan: inpatient  Time spent: 18  Costin M. Elvera Lennox, MD [facility] Pager [phone]  If 7PM-7AM, please contact night-coverage [email or web address] Password [facility] 01/02/2013, 8:52 PM

## 2013-01-02 NOTE — Discharge Summary (Signed)
Physician Discharge Summary  Ashley Chambers ZOX:[id number] DOB: March 25, 1931 DOA: 12/28/2012  PCP: Dr. Tally Joe  Admit date: 12/28/2012 Discharge date: 01/02/2013  Time spent: >30 minutes  Recommendations for Outpatient Follow-up:  1. CBC to follow Hgb trend  Discharge Diagnoses:  PUD and dyspepsia GERD GIB (gastrointestinal bleeding) Acute blood loss anemia High cholesterol Hyperkalemia Spinal stenosis with lower back pain and neuropathy  Discharge Condition: stable and improved. Patient will follow with GI as instructed and with PCP in 1 week  Diet recommendation: low residue diet  Filed Weights   12/29/12 0247  Weight: 56 kg (123 lb 7.3 oz)    History of present illness:  79 yo overall healthy female comes in after several episodes of n/v/d that started suddenly this am with some generalized abd cramping. Pt states she suddenly started vomiting, several times this morning then started with profuse diarrhea soonafter. Noted her vomit to be dark in color and also dark colored stool. This happened for several hours then came to ED. In ED she has not had any further abd pain, n,v or dairrhea. No bleeding. However her stool was noted to be heme pos by EDP. Pt denies any sick contacts. No fevers, but was overall feeling general malaise all day. No rashes. Feels much better now after recieveing ivf in ED.   Hospital Course:  Nausea/vomiting  -Now resolved  -most likely secondary to dyspepsia due to PUD  -will discharge home with instructions to follow low fiber diet  -continue PPI BID  Acute blood loss anemia  -secondary to duodenal ulcer; most likely induce from NSAID's use  -s/p epinephrine injection and clipping by GI (12/30/12)  -will discontinue ASA and NSAID's -will discharge on protonix and Carafate  -will follow Hgb trend; has received 4 units of PRBC's total  -Hgb this morning is 8.0; patient w/o black stools overnight -will need CBC in 1 week during follow up   -Pharmacy provided IV iron during this admission and will discharge on ferrous fumarate  High cholesterol  -continue statin therapy   Physical deconditioning: Discharge home with [facility] services and family care/assistance  Hyperkalemia  -potassium now WNL  Spinal stenosis and low back pain with neuropathy -advised to avoid NSAID's -started on lyrica and PRN tramadol -PRN tylenol for breakthrough pain -further treatment as an outpatient per PCP  Procedures:  EGD (positive acutely bleeding duodenal ulcer)  Consultations:  [facility] (Dr. Randa Evens)  Discharge Exam: Filed Vitals:   01/02/13 0449  BP: 123/60  Pulse: 75  Temp: 98 F (36.7 C)  Resp:    General: Pt is alert, follows commands appropriately, not in acute distress; is feeling better. No BM last night. Reports dyspepsia symptoms are well controlled.  Cardiovascular: Regular rate and rhythm, S1/S2, no murmurs, no rubs, no gallops  Respiratory: Clear to auscultation bilaterally, no wheezing, no crackles, no rhonchi  Abdomen: Soft, non tender, non distended, bowel sounds present, no guarding  Extremities: No edema, pulses DP and PT palpable bilaterally  Neuro: Grossly nonfocal  Discharge Instructions  Discharge Orders   Future Orders Complete By Expires   Discharge instructions  As directed    Comments:     -take medications as prescribed -follow with PCP in 1 week -call GI office to set up follow up appointment -keep yourself well hydrated -follow a low fiber/low residue diet       Medication List    STOP taking these medications       aspirin 325 MG tablet  calcium carbonate 500 MG chewable tablet  Commonly known as:  TUMS - dosed in mg elemental calcium     Magnesium 100 MG Caps     naproxen sodium 220 MG tablet  Commonly known as:  ANAPROX      TAKE these medications       alendronate 70 MG tablet  Commonly known as:  FOSAMAX  Take 70 mg by mouth every 7 (seven) days. Take  with a full glass of water on an empty stomach.     ALLERGY MED PO  Take 2 tablets by mouth daily.     CENTRUM SILVER PO  Take 1 tablet by mouth daily.     CLEAR EYES OP  Place 2 drops into both eyes as needed (dry eyes).     co-enzyme Q-10 30 MG capsule  Take 1 capsule (30 mg total) by mouth 3 (three) times daily. Hold until follow up with PCP     Ferrous Fumarate-DSS Tabs  Take 1 tablet by mouth 2 (two) times daily.     pantoprazole 40 MG tablet  Commonly known as:  PROTONIX  Take 1 tablet (40 mg total) by mouth 2 (two) times daily.     polyethylene glycol packet  Commonly known as:  MIRALAX / GLYCOLAX  Take 17 g by mouth daily.     pregabalin 25 MG capsule  Commonly known as:  LYRICA  Take 1 capsule (25 mg total) by mouth at bedtime.     rosuvastatin 10 MG tablet  Commonly known as:  CRESTOR  Take 10 mg by mouth daily.     sucralfate 1 G tablet  Commonly known as:  CARAFATE  Take 1 tablet (1 g total) by mouth 3 (three) times daily.     traMADol 50 MG tablet  Commonly known as:  ULTRAM  Take 1 tablet (50 mg total) by mouth every 6 (six) hours as needed for pain.     Vitamin D3 400 UNITS Caps  Take 1 capsule by mouth daily.     zolpidem 5 MG tablet  Commonly known as:  AMBIEN  Take 5 mg by mouth at bedtime.       Allergies  Allergen Reactions  . Sulfa Antibiotics Other (See Comments)    unknown       Follow-up Information   Follow up with [facility]. [facility])    Contact information:   [address] [place] [address] [phone]       Follow up with Sissy Hoff, MD. Schedule an appointment as soon as possible for a visit in 1 week.   Specialty:  Family Medicine   Contact information:   [address] [place] [address] [phone]       Call [facility] Burna Mortimer, MD. (call office to set up follow up appointment)    Specialty:  Gastroenterology   Contact  information:   [address]                         [place] [address] [phone]       The results of significant diagnostics from this hospitalization (including imaging, microbiology, ancillary and laboratory) are listed below for reference.    Labs: Basic Metabolic Panel:  Recent Labs Lab 12/28/12 2056 12/29/12 0351  NA 140 140  K 5.3* 4.4  CL 103 106  CO2 24 27  GLUCOSE  169* 132*  BUN 43* 35*  CREATININE 0.51 0.62  CALCIUM 9.4 8.7   CBC:  Recent Labs Lab 12/28/12 2056 12/29/12 0351 12/29/12 0955  12/29/12 2210 12/30/12 0800 12/31/12 0414 01/01/13 0430 01/02/13 0556  WBC 14.3* 9.0 7.4  --   --   --  7.0 6.5 5.9  NEUTROABS 12.8*  --   --   --   --   --   --   --   --   HGB 13.2 10.8*  10.8* 10.0*  < > 7.4* 9.4* 8.0* 7.8* 8.0*  HCT 39.7 32.0*  32.6* 30.0*  < > 22.4* 27.2* 22.9* 23.5* 23.5*  MCV 90.4 91.1 90.9  --   --   --  87.7 88.3 89.0  PLT 277 242 209  --   --   --  146* 165 179  < > = values in this interval not displayed.   Signed:  Dicie Edelen  [facility] 01/02/2013, 1:06 PM

## 2013-01-02 NOTE — Progress Notes (Signed)
Physical Therapy Treatment Patient Details Name: Ashley Chambers MRN: [id number] DOB: 01-Apr-1931 Today's Date: 01/02/2013 Time: 9562-1308 PT Time Calculation (min): 25 min  PT Assessment / Plan / Recommendation  History of Present Illness 79 y.o. female admitted with nausea, vomiting, diarrhea, heme positive stool.    PT Comments   Pt receiving iron via IV. Assisted OOB to amb in hallway twice with one sitting rest break.  Pt feeling better and hopeful to D/C to home today.    Follow Up Recommendations  Home health PT     Does the patient have the potential to tolerate intense rehabilitation     Barriers to Discharge        Equipment Recommendations  None recommended by PT    Recommendations for Other Services    Frequency Min 3X/week   Progress towards PT Goals Progress towards PT goals: Progressing toward goals  Plan      Precautions / Restrictions Precautions Precautions: Fall Restrictions Weight Bearing Restrictions: No    Pertinent Vitals/Pain No c/o pain    Mobility  Bed Mobility Bed Mobility: Supine to Sit;Sit to Supine Supine to Sit: 6: Modified independent (Device/Increase time) Sit to Supine: 6: Modified independent (Device/Increase time) Details for Bed Mobility Assistance: increased time Transfers Transfers: Sit to Stand;Stand to Sit Sit to Stand: 4: Min guard;From bed Stand to Sit: 4: Min guard;To bed Details for Transfer Assistance: increased time Ambulation/Gait Ambulation/Gait Assistance: 4: Min guard Ambulation Distance (Feet): 260 Feet (130 x 2) Assistive device: 1 person hand held assist Ambulation/Gait Assistance Details: Pt still declined use of walker and was unsteady with slight stagger and fatigued quickly. One sitting rest break. Gait Pattern: Step-through pattern Gait velocity: decreased.     PT Goals (current goals can now be found in the care plan section)    Visit Information  Last PT Received On: 01/02/13 Assistance Needed:  +1 History of Present Illness: 79 y.o. female admitted with nausea, vomiting, diarrhea, heme positive stool.     Subjective Data      Cognition       Balance     End of Session PT - End of Session Equipment Utilized During Treatment: Gait belt Activity Tolerance: Patient limited by fatigue;Patient tolerated treatment well Patient left: in bed;with call bell/phone within reach;with family/visitor present   Felecia Shelling  PTA [facility]  Acute  Rehab Pager      [phone]

## 2013-01-03 ENCOUNTER — Encounter (HOSPITAL_COMMUNITY)
Admission: EM | Disposition: A | Discharge status: Home Health | Payer: Medicare Other | Source: Home / Self Care | Attending: Internal Medicine

## 2013-01-03 ENCOUNTER — Encounter (HOSPITAL_COMMUNITY): Payer: Self-pay | Admitting: *Deleted

## 2013-01-03 DIAGNOSIS — E7800 Pure hypercholesterolemia, unspecified: Secondary | ICD-10-CM

## 2013-01-03 HISTORY — PX: ESOPHAGOGASTRODUODENOSCOPY: SHX5428

## 2013-01-03 LAB — CBC
MCV: 89.3 fL (ref 78.0–100.0)
Platelets: 202 10*3/uL (ref 150–400)
RDW: 14.8 % (ref 11.5–15.5)
WBC: 5.6 10*3/uL (ref 4.0–10.5)

## 2013-01-03 LAB — HEMOGLOBIN: Hemoglobin: 8.2 g/dL — ABNORMAL LOW (ref 12.0–15.0)

## 2013-01-03 SURGERY — EGD (ESOPHAGOGASTRODUODENOSCOPY)
Anesthesia: Moderate Sedation

## 2013-01-03 MED ORDER — SODIUM CHLORIDE 0.9 % IV SOLN: INTRAVENOUS | Status: DC

## 2013-01-03 MED ORDER — MIDAZOLAM HCL 10 MG/2ML IJ SOLN
INTRAMUSCULAR | Status: DC | PRN
  Administered 2013-01-03: 2 mg via INTRAVENOUS
  Administered 2013-01-03: 1 mg via INTRAVENOUS
  Administered 2013-01-03: 2 mg via INTRAVENOUS
  Administered 2013-01-03: 1 mg via INTRAVENOUS

## 2013-01-03 MED ORDER — PREGABALIN 25 MG PO CAPS
25.0000 mg | ORAL_CAPSULE | Freq: Every day | ORAL | Status: DC
  Administered 2013-01-03: 25 mg via ORAL
  Filled 2013-01-03: qty 1

## 2013-01-03 MED ORDER — MIDAZOLAM HCL 10 MG/2ML IJ SOLN
INTRAMUSCULAR | Status: AC
  Filled 2013-01-03: qty 2

## 2013-01-03 MED ORDER — BUTAMBEN-TETRACAINE-BENZOCAINE 2-2-14 % EX AERO
INHALATION_SPRAY | CUTANEOUS | Status: DC | PRN
  Administered 2013-01-03: 2 via TOPICAL

## 2013-01-03 MED ORDER — FERROUS FUMARATE 325 (106 FE) MG PO TABS
1.0000 | ORAL_TABLET | Freq: Every day | ORAL | Status: DC
  Administered 2013-01-03 – 2013-01-04 (×2): 106 mg via ORAL
  Filled 2013-01-03 (×3): qty 1

## 2013-01-03 MED ORDER — FENTANYL CITRATE 0.05 MG/ML IJ SOLN
INTRAMUSCULAR | Status: AC
  Filled 2013-01-03: qty 2

## 2013-01-03 MED ORDER — LIP MEDEX EX OINT
TOPICAL_OINTMENT | CUTANEOUS | Status: DC | PRN
  Filled 2013-01-03: qty 7

## 2013-01-03 MED ORDER — FERROUS FUMARATE-DSS PO TABS: 1.0000 | ORAL_TABLET | Freq: Two times a day (BID) | ORAL | Status: DC

## 2013-01-03 MED ORDER — FENTANYL CITRATE 0.05 MG/ML IJ SOLN
INTRAMUSCULAR | Status: DC | PRN
  Administered 2013-01-03 (×2): 25 ug via INTRAVENOUS

## 2013-01-03 MED ORDER — PEG 3350-KCL-NA BICARB-NACL 420 G PO SOLR
4000.0000 mL | Freq: Once | ORAL | Status: AC
  Administered 2013-01-03: 4000 mL via ORAL
  Filled 2013-01-03: qty 4000

## 2013-01-03 MED ORDER — SUCRALFATE 1 G PO TABS
1.0000 g | ORAL_TABLET | Freq: Three times a day (TID) | ORAL | Status: DC
  Administered 2013-01-03 – 2013-01-04 (×2): 1 g via ORAL
  Filled 2013-01-03 (×6): qty 1

## 2013-01-03 MED ORDER — ATORVASTATIN CALCIUM 20 MG PO TABS
20.0000 mg | ORAL_TABLET | Freq: Every day | ORAL | Status: DC
  Filled 2013-01-03 (×2): qty 1

## 2013-01-03 NOTE — Progress Notes (Signed)
[facility] PROGRESS NOTE  RONEKA GILPIN JYN:[id number] DOB: 21-Dec-1930 DOA: 01/02/2013 PCP: Tally Joe  Assessment/Plan: 1-GI bleed and acute blood anemia: patient recently discharged with PUD, s/p clipping and epinephrine injection. After she got home experienced 2 large bloody stools and the description was BRBPR. -Hgb remains stable; will follow trend -EGD demonstrated no active bleeding from recently clipped ulcer -will continue PPI and carafate, clear liquid diet and colonoscopy in am.  2-chronic back pain: continue qhs lyrica and PRN tramadol  3-HLD: continue statins. DVT: SCD's  Code Status: Full Family Communication: husband and son at bedside Disposition Plan: home with [facility] services when medically stable.   Consultants:  GI  Procedures:  EGD (demonstarted clip in good place and no active bleeding from duodenal ulcer)  Antibiotics:  None   HPI/Subjective: NAD, afebrile. Sleepy after EGD  Objective: Filed Vitals:   01/03/13 2113  BP: 124/60  Pulse: 80  Temp: 98.2 F (36.8 C)  Resp: 18    Intake/Output Summary (Last 24 hours) at 01/03/13 2125 Last data filed at 01/03/13 1516  Gross per 24 hour  Intake 738.75 ml  Output    200 ml  Net 538.75 ml   Filed Weights   01/02/13 2047  Weight: 55.8 kg (123 lb 0.3 oz)    Exam:   General:  Sleepy after EGD, NAD  Cardiovascular: RRR, no rubs or gallops  Respiratory: CTA bilaterally  Abdomen: soft, no guarding, positive BS  Musculoskeletal: no edema, no cyanosis, no clubbing  Data Reviewed: Basic Metabolic Panel:  Recent Labs Lab 12/28/12 2056 12/29/12 0351 01/02/13 1815  NA 140 140 141  K 5.3* 4.4 4.1  CL 103 106 105  CO2 24 27 26   GLUCOSE 169* 132* 119*  BUN 43* 35* 12  CREATININE 0.51 0.62 0.61  CALCIUM 9.4 8.7 9.6   Liver Function Tests:  Recent Labs Lab 01/02/13 1815  AST 24  ALT 24  ALKPHOS 39  BILITOT 0.2*  PROT 5.9*  ALBUMIN 3.3*   No results found for this  basename: LIPASE, AMYLASE,  in the last 168 hours No results found for this basename: AMMONIA,  in the last 168 hours CBC:  Recent Labs Lab 12/28/12 2056  12/31/12 0414 01/01/13 0430 01/02/13 0556 01/02/13 1815 01/02/13 2335 01/03/13 0444  WBC 14.3*  < > 7.0 6.5 5.9 6.9  --  5.6  NEUTROABS 12.8*  --   --   --   --   --   --   --   HGB 13.2  < > 8.0* 7.8* 8.0* 8.8* 8.2* 7.9*  HCT 39.7  < > 22.9* 23.5* 23.5* 26.4*  --  23.4*  MCV 90.4  < > 87.7 88.3 89.0 89.8  --  89.3  PLT 277  < > 146* 165 179 198  --  202  < > = values in this interval not displayed. Cardiac Enzymes: No results found for this basename: CKTOTAL, CKMB, CKMBINDEX, TROPONINI,  in the last 168 hours BNP (last 3 results) No results found for this basename: PROBNP,  in the last 8760 hours CBG: No results found for this basename: GLUCAP,  in the last 168 hours  No results found for this or any previous visit (from the past 240 hour(s)).   Studies: No results found.  Scheduled Meds: . atorvastatin  20 mg Oral q1800  . ferrous fumarate  1 tablet Oral Daily  . pantoprazole (PROTONIX) IV  40 mg Intravenous Q12H  . pregabalin  25 mg  Oral QHS  . sodium chloride  3 mL Intravenous Q12H  . sucralfate  1 g Oral TID AC   Continuous Infusions: . sodium chloride 75 mL/hr (01/03/13 1219)  . sodium chloride      Active Problems:   GIB (gastrointestinal bleeding)   Nausea & vomiting   Diarrhea   Acute blood loss anemia    Time spent: >30 minutes    Meyer Arora  [facility] Pager [phone]. If 7PM-7AM, please contact night-coverage at [email or web address], password [facility] 01/03/2013, 9:25 PM  LOS: 1 day

## 2013-01-03 NOTE — Progress Notes (Signed)
[facility] PROGRESS NOTE Subjective Pt was hospitalized this week with GI bleed  EGD showed DU with clot and oozing Injected with epi and endo clip was applied. Pt discharged yesterday and began bleeding again last night. Hg 8.8 yest AM down to 8.2 then 7.9 this AM. Has dark stool as well as BRB. Sigmoid 4 days ago attempted after enemas but showed only melana and stool, no BRB.  Objective: Vital signs in last 24 hours: Temp:  [97.9 F (36.6 C)-98.4 F (36.9 C)] 97.9 F (36.6 C) (09/14 0545) Pulse Rate:  [72-101] 72 (09/14 0545) Resp:  [18] 18 (09/14 0545) BP: (132-176)/(50-74) 132/50 mmHg (09/14 0545) SpO2:  [95 %-100 %] 95 % (09/14 0545) Weight:  [55.8 kg (123 lb 0.3 oz)] 55.8 kg (123 lb 0.3 oz) (09/13 2047)    Intake/Output from previous day: 09/13 0701 - 09/14 0700 In: 678.8 [I.V.:678.8] Out: 200 [Urine:200] Intake/Output this shift:    PE: General--alert reading the paper Heart-- Lungs--clear Abdomen--soft  Lab Results:  Recent Labs  01/01/13 0430 01/02/13 0556 01/02/13 1815 01/02/13 2335 01/03/13 0444  WBC 6.5 5.9 6.9  --  5.6  HGB 7.8* 8.0* 8.8* 8.2* 7.9*  HCT 23.5* 23.5* 26.4*  --  23.4*  PLT 165 179 198  --  202   BMET  Recent Labs  01/02/13 1815  NA 141  K 4.1  CL 105  CO2 26  CREATININE 0.61   LFT  Recent Labs  01/02/13 1815  PROT 5.9*  AST 24  ALT 24  ALKPHOS 39  BILITOT 0.2*   PT/INR No results found for this basename: LABPROT, INR,  in the last 72 hours PANCREAS No results found for this basename: LIPASE,  in the last 72 hours       Studies/Results: No results found.  Medications: I have reviewed the patient's current medications.  Assessment/Plan: 1. GI Bleeding. Minimal decrease in Hg and BUN/creat normal. ? Bleeding from DU or LGI source. Sigmoid 4 days ago not very useful.  Will proceed with the EGD first and ? Colon later. Discussed with pt and her family.   Roddy Bellamy JR,Zavon Hyson L 01/03/2013, 7:25 AM

## 2013-01-03 NOTE — Interval H&P Note (Signed)
History and Physical Interval Note:  01/03/2013 8:54 AM  Ashley Chambers  has presented today for surgery, with the diagnosis of GI Bleeding  The various methods of treatment have been discussed with the patient and family. After consideration of risks, benefits and other options for treatment, the patient has consented to  Procedure(s): ESOPHAGOGASTRODUODENOSCOPY (EGD) (N/A) as a surgical intervention .  The patient's history has been reviewed, patient examined, no change in status, stable for surgery.  I have reviewed the patient's chart and labs.  Questions were answered to the patient's satisfaction.     Suni Jarnagin JR,Ellena Kamen L

## 2013-01-03 NOTE — Op Note (Signed)
[facility] [address] [place], [address]   ENDOSCOPY PROCEDURE REPORT  PATIENT: Ashley Chambers, Ashley Chambers  MR#: [id number] BIRTHDATE: Jan 19, 1931 , 81  yrs. old GENDER: Female ENDOSCOPIST:Ainhoa Rallo Randa Evens, MD REFERRED BY:  [facility]. PCP: Dr. Azucena Cecil PROCEDURE DATE:  01/03/2013 PROCEDURE:    EGD ASA CLASS:     class III INDICATIONS: patient was hospitalized with G.I. bleeding this week. 3 days ago she underwent EGD in sigmoidoscopy. She had been taking Naprosyn. She was having melena as well as some BRB. EGD revealed several shallow gastric ulcers that were not bleeding, duodenal erosions and probable bleeding duodenal ulcer with a clot. And endoclip was applied in the altar area was injected with epinephrine. The patient had no more clinical bleeding and was discharged home yesterday. Sigmoidoscopy done at the time of EGD only showed stool and melena with no bright blood. Patient went home yesterday and then began to have increasing melena and some bright red blood again. She was readmitted. MEDICATION:  fentanyl 50 mcg, versed 7 mg IV TOPICAL ANESTHETIC:    cetacaine spray  DESCRIPTION OF PROCEDURE:   The procedure was explained to the patient and her family and consent obtained.. The Pentax adult endoscope was inserted blindly into the esophagus with swallowing. There was hiatal hernia at the esophagus was otherwise normal. The stomach did not reveal any active bleeding. There were still several shallow gastric ulcers in the antrum around pyloric channel that were not bleeding. The scope is passed into the duodenum and there was no active bleeding. The endoclip is seen to be in good position on the anterior wall at the junction of the duodenal bulb in the 2nd duodenum. There was no active bleeding at all. The 2nd duodenum was normal. The area of the endoclip was still slightly inflamed and friable but was not actively bleeding. No therapeutic maneuvers were  required. The scope was withdrawn in the patient tolerated procedure well. There were no immediate complications.     COMPLICATIONS: None  ENDOSCOPIC IMPRESSION: 1. G.I. Bleeding. No upper G.I. source identified. 2. Duodenal Ulcer/Gastric Ulcers. Not actively bleeding. Endoclip in good position at the duodenal ulcer.  RECOMMENDATIONS: 1. will place the patient in clear liquids and plain colonoscopy tomorrow. 2. Transfuse as needed. 3. Would continue on aggressive PPI therapy probably could just give BID.    _______________________________ Rosalie DoctorCarman Ching, MD 01/03/2013 9:30 AM  CC: Dr. Azucena Cecil   PATIENT NAME:  [name], [name] MR#: [id number]

## 2013-01-04 ENCOUNTER — Encounter (HOSPITAL_COMMUNITY)
Admission: EM | Disposition: A | Discharge status: Home Health | Payer: Self-pay | Source: Home / Self Care | Attending: Internal Medicine

## 2013-01-04 ENCOUNTER — Encounter (HOSPITAL_COMMUNITY)
Admission: EM | Disposition: A | Discharge status: Home Health | Payer: Medicare Other | Source: Home / Self Care | Attending: Internal Medicine

## 2013-01-04 ENCOUNTER — Encounter (HOSPITAL_COMMUNITY): Payer: Self-pay | Admitting: Gastroenterology

## 2013-01-04 DIAGNOSIS — R5381 Other malaise: Secondary | ICD-10-CM

## 2013-01-04 HISTORY — PX: COLONOSCOPY: SHX5424

## 2013-01-04 LAB — CBC
Platelets: 204 10*3/uL (ref 150–400)
RDW: 14.9 % (ref 11.5–15.5)
WBC: 5.5 10*3/uL (ref 4.0–10.5)

## 2013-01-04 LAB — BASIC METABOLIC PANEL
Chloride: 106 mEq/L (ref 96–112)
GFR calc Af Amer: >90 mL/min (ref >90)
Potassium: 3.6 mEq/L (ref 3.5–5.1)

## 2013-01-04 LAB — OCCULT BLOOD, POC DEVICE: Fecal Occult Bld: POSITIVE — AB

## 2013-01-04 SURGERY — COLONOSCOPY
Anesthesia: Moderate Sedation

## 2013-01-04 MED ORDER — FENTANYL CITRATE 0.05 MG/ML IJ SOLN
INTRAMUSCULAR | Status: DC | PRN
  Administered 2013-01-04 (×3): 25 ug via INTRAVENOUS

## 2013-01-04 MED ORDER — COENZYME Q10 30 MG PO CAPS: 30.0000 mg | ORAL_CAPSULE | Freq: Three times a day (TID) | ORAL | Status: DC

## 2013-01-04 MED ORDER — FENTANYL CITRATE 0.05 MG/ML IJ SOLN
INTRAMUSCULAR | Status: AC
  Filled 2013-01-04: qty 2

## 2013-01-04 MED ORDER — MIDAZOLAM HCL 5 MG/5ML IJ SOLN
INTRAMUSCULAR | Status: DC | PRN
  Administered 2013-01-04 (×2): 1 mg via INTRAVENOUS
  Administered 2013-01-04: 2 mg via INTRAVENOUS
  Administered 2013-01-04: 1 mg via INTRAVENOUS

## 2013-01-04 MED ORDER — MIDAZOLAM HCL 10 MG/2ML IJ SOLN
INTRAMUSCULAR | Status: AC
  Filled 2013-01-04: qty 2

## 2013-01-04 NOTE — Progress Notes (Signed)
Spoke with pt, she will continue with [facility] for Home Health.

## 2013-01-04 NOTE — Discharge Summary (Signed)
Physician Discharge Summary  Ashley Chambers JXB:[id number] DOB: 03-May-1930 DOA: 01/02/2013  PCP: No primary provider on file.  Admit date: 01/02/2013 Discharge date: 01/04/2013  Time spent: >30 minutes  Recommendations for Outpatient Follow-up:  Follow CBC to evaluate Hgb trend Discharge Diagnoses:  GIB (gastrointestinal bleeding) Acute blood loss anemia BRBPR Hemorrhoids Small diverticula Spinal stenosis HLD     Discharge Condition: stable and improved. Will follow with PCP in 1 week. Normal VVS and no active bleeding appreciated on EGD or colonoscopy.  Diet recommendation: low fiber diet  Filed Weights   01/02/13 2047  Weight: 55.8 kg (123 lb 0.3 oz)    History of present illness:  79 y.o. female just discharged today from the hospital status post a GI bleed due to a duodenal ulcer, she had EGD done about 3 days ago, and she required 4 units of packed RBCs while hospitalized. Her hemoglobin has been stable following the EGD, and there was no further evidence of bleeding, and patient was discharge home today. She presents to the ED again today stating that she has had 4 episodes of bright red blood per rectum at home. In the emergency room, her hemoglobin was 8.8 which is in fact better than the one this morning. She had another bloody bowel movement in the emergency room. She endorses mild lightheadedness when she stands up. She appreciates her symptoms were much more severe last time she was admitted. Hospitalist service was asked for admission. Patient denies any chest pain, denies any shortness of breath, she has no abdominal pain. She denies any nausea or vomiting.   Hospital Course:  GI bleed: patient recently discharged with PUD, s/p clipping and epinephrine injection. After she got home experienced 2 large bloody stools and the description was BRBPR.  -Hgb has remained stable; will follow trend in outpatient setting with next follow up visit. -EGD demonstrated no active  bleeding from recently clipped ulcer  -colonoscopy with small internal hemorrhoids and afew diverticula; no acute bleeding. -will discharge on low residue diet, PPI BID and carafate -patient advise to keep herself hydrated and to take medications as prescribed  Acute blood loss anemia  -secondary to duodenal ulcer; most likely induce from NSAID's use  -s/p epinephrine injection and clipping by GI (12/30/12)  -will discontinue ASA and NSAID's  -will discharge on protonix and Carafate  -no transfusion given during second admission -Hgb this morning is 7.9 -will need CBC in 1 week during follow up  -Pharmacy provided IV iron during this admission and will discharge on ferrous fumarate   High cholesterol  -continue statin therapy   Physical deconditioning:  Discharge home with [facility] services and family care/assistance  Spinal stenosis and low back pain with neuropathy  -advised to avoid NSAID's  -started on lyrica and PRN tramadol  -PRN tylenol for breakthrough pain  -further treatment as an outpatient per PCP   Procedures:  EGD (stable duodenal ulcer w/o signs of acute bleeding; clip in perfect position)  Colonoscopy (positive for 2 tiny internal hemorrhoids and few left sided diverticula; no active bleeding appreciated. Patient with small old clots from previous bleeding)  Consultations:  GI  Discharge Exam: Filed Vitals:   01/04/13 1013  BP: 97/40  Pulse: 71  Temp: 98 F (36.7 C)  Resp: 12   General: Sleepy after EGD, NAD  Cardiovascular: RRR, no rubs or gallops  Respiratory: CTA bilaterally  Abdomen: soft, no guarding, positive BS  Musculoskeletal: no edema, no cyanosis, no clubbing  Discharge Instructions  Discharge Orders   Future Orders Complete By Expires   Discharge instructions  As directed    Comments:     Take medications as prescribed Follow with PCP in 1 week Keep yourself well hydrated Follow a low fiber/low residue diet   Home Health  As  directed    Scheduling Instructions:     Please resume previous home health orders   Questions:     To provide the following care/treatments:  PT   OT       Medication List    STOP taking these medications       zolpidem 5 MG tablet  Commonly known as:  AMBIEN      TAKE these medications       alendronate 70 MG tablet  Commonly known as:  FOSAMAX  Take 70 mg by mouth every 7 (seven) days. Take with a full glass of water on an empty stomach.     CENTRUM SILVER PO  Take 1 tablet by mouth daily.     CLEAR EYES OP  Place 2 drops into both eyes as needed (dry eyes).     co-enzyme Q-10 30 MG capsule  Take 1 capsule (30 mg total) by mouth 3 (three) times daily. Hold until follow up with PCP     Ferrous Fumarate-DSS Tabs  Take 1 tablet by mouth 2 (two) times daily.     pantoprazole 40 MG tablet  Commonly known as:  PROTONIX  Take 1 tablet (40 mg total) by mouth 2 (two) times daily.     polyethylene glycol packet  Commonly known as:  MIRALAX / GLYCOLAX  Take 17 g by mouth daily.     pregabalin 25 MG capsule  Commonly known as:  LYRICA  Take 1 capsule (25 mg total) by mouth at bedtime.     rosuvastatin 10 MG tablet  Commonly known as:  CRESTOR  Take 10 mg by mouth daily.     sucralfate 1 G tablet  Commonly known as:  CARAFATE  Take 1 tablet (1 g total) by mouth 3 (three) times daily.     traMADol 50 MG tablet  Commonly known as:  ULTRAM  Take 1 tablet (50 mg total) by mouth every 6 (six) hours as needed for pain.     Vitamin D3 400 UNITS Caps  Take 1 capsule by mouth daily.       Allergies  Allergen Reactions  . Nsaids     Gi bleeding  . Sulfa Antibiotics Other (See Comments)    unknown       Follow-up Information   Follow up with Sissy Hoff, MD In 1 week.   Specialty:  Family Medicine   Contact information:   [address] [place] [address] [phone]        The results of significant diagnostics from this hospitalization  (including imaging, microbiology, ancillary and laboratory) are listed below for reference.    Labs: Basic Metabolic Panel:  Recent Labs Lab 12/28/12 2056 12/29/12 0351 01/02/13 1815 01/04/13 1114  NA 140 140 141 141  K 5.3* 4.4 4.1 3.6  CL 103 106 105 106  CO2 24 27 26 25   GLUCOSE 169* 132* 119* 84  BUN 43* 35* 12 6  CREATININE 0.51 0.62 0.61 0.59  CALCIUM 9.4 8.7 9.6 8.7   Liver Function Tests:  Recent Labs Lab 01/02/13 1815  AST 24  ALT 24  ALKPHOS 39  BILITOT 0.2*  PROT 5.9*  ALBUMIN 3.3*  CBC:  Recent Labs Lab 12/28/12 2056  01/01/13 0430 01/02/13 0556 01/02/13 1815 01/02/13 2335 01/03/13 0444 01/04/13 1114  WBC 14.3*  < > 6.5 5.9 6.9  --  5.6 5.5  NEUTROABS 12.8*  --   --   --   --   --   --   --   HGB 13.2  < > 7.8* 8.0* 8.8* 8.2* 7.9* 7.8*  HCT 39.7  < > 23.5* 23.5* 26.4*  --  23.4* 23.3*  MCV 90.4  < > 88.3 89.0 89.8  --  89.3 90.7  PLT 277  < > 165 179 198  --  202 204  < > = values in this interval not displayed.   Signed:  Milinda Sweeney  [facility] 01/04/2013, 2:13 PM

## 2013-01-04 NOTE — Op Note (Signed)
[facility] [address] [place], [address]   COLONOSCOPY PROCEDURE REPORT  PATIENT: Ashley, Chambers  MR#: [id number] BIRTHDATE: 14-Dec-1930 , 81  yrs. old GENDER: Female ENDOSCOPIST: Vida Rigger, MD REFERRED BY: PROCEDURE DATE:  01/04/2013 PROCEDURE:   Colonoscopy, diagnostic  ASA CLASS:   Class II INDICATIONS:Anemia, non-specific and hematochezia. MEDICATIONS: Fentanyl 75 mcg IV and Versed 5 mg IV  DESCRIPTION OF PROCEDURE:   After the risks benefits and alternatives of the procedure were thoroughly explained, informed consent was obtained.  A digital rectal exam revealed external hemorrhoids.   The Pentax Ped Colon [id number]  endoscope was introduced through the anus and advanced to the terminal ileum which was intubated for a short distance. [Referring Physician] The quality of the prep was adequate.  The instrument was then slowly withdrawn as the colon was fully examined. there is no signs of active bleeding on insertion and no blood in the terminal ileum and the scope was slowly withdrawn and a few old small  clots were seen but otherwise no other significant findings and anal rectal pull-through in retroflexion was done back in the rectum and the endoscope was reinserted a short ways up the left side of the colon air and water was suctioned the scope was slowly withdrawn the patient tolerated the procedure well there was no obvious immediate complication        The time to cecum=  .  Withdrawal time=  .  The scope was withdrawn and the procedure completed. COMPLICATIONS: There were no complications.  ENDOSCOPIC IMPRESSION:1. Tiny internal/external hemorrhoid 2 few small left-sided diverticula 3 otherwise within normal limits to the terminal ileum without signs of active bleeding and only a few small old clots seen  RECOMMENDATIONS:slowly advance diet hopefully home soon happy see back when necessary warned that iron might turn her stools  black and no aspirin or nonsteroidals long-term due  to her significant ulcer and continue pump inhibitors   eSigned:  Vida Rigger, MD 01/04/2013 10:14 AM   cc:   PATIENT NAME:  Ashley, Chambers MR#: [id number]

## 2013-01-05 ENCOUNTER — Encounter (HOSPITAL_COMMUNITY): Payer: Self-pay | Admitting: Gastroenterology

## 2013-01-11 ENCOUNTER — Observation Stay (HOSPITAL_COMMUNITY)
Admission: EM | Admit: 2013-01-11 | Discharge: 2013-01-13 | Disposition: A | Discharge status: Home / Self Care | Payer: Medicare Other | Attending: Internal Medicine | Admitting: Internal Medicine

## 2013-01-11 ENCOUNTER — Encounter (HOSPITAL_COMMUNITY): Payer: Self-pay | Admitting: Emergency Medicine

## 2013-01-11 DIAGNOSIS — D649 Anemia, unspecified: Secondary | ICD-10-CM | POA: Diagnosis present

## 2013-01-11 DIAGNOSIS — K921 Melena: Principal | ICD-10-CM | POA: Insufficient documentation

## 2013-01-11 DIAGNOSIS — K922 Gastrointestinal hemorrhage, unspecified: Secondary | ICD-10-CM | POA: Insufficient documentation

## 2013-01-11 DIAGNOSIS — K449 Diaphragmatic hernia without obstruction or gangrene: Secondary | ICD-10-CM | POA: Insufficient documentation

## 2013-01-11 DIAGNOSIS — K208 Other esophagitis without bleeding: Secondary | ICD-10-CM | POA: Insufficient documentation

## 2013-01-11 DIAGNOSIS — D62 Acute posthemorrhagic anemia: Secondary | ICD-10-CM | POA: Insufficient documentation

## 2013-01-11 DIAGNOSIS — K259 Gastric ulcer, unspecified as acute or chronic, without hemorrhage or perforation: Secondary | ICD-10-CM | POA: Insufficient documentation

## 2013-01-11 DIAGNOSIS — K269 Duodenal ulcer, unspecified as acute or chronic, without hemorrhage or perforation: Secondary | ICD-10-CM | POA: Diagnosis present

## 2013-01-11 DIAGNOSIS — Z79899 Other long term (current) drug therapy: Secondary | ICD-10-CM | POA: Insufficient documentation

## 2013-01-11 DIAGNOSIS — E079 Disorder of thyroid, unspecified: Secondary | ICD-10-CM | POA: Insufficient documentation

## 2013-01-11 DIAGNOSIS — E7800 Pure hypercholesterolemia, unspecified: Secondary | ICD-10-CM | POA: Insufficient documentation

## 2013-01-11 DIAGNOSIS — R195 Other fecal abnormalities: Secondary | ICD-10-CM

## 2013-01-11 HISTORY — DX: Peptic ulcer, site unspecified, unspecified as acute or chronic, without hemorrhage or perforation: K27.9

## 2013-01-11 LAB — COMPREHENSIVE METABOLIC PANEL
AST: 28 U/L (ref 0–37)
Albumin: 3.9 g/dL (ref 3.5–5.2)
Alkaline Phosphatase: 58 U/L (ref 39–117)
BUN: 9 mg/dL (ref 6–23)
Chloride: 99 mEq/L (ref 96–112)
Creatinine, Ser: 0.66 mg/dL (ref 0.50–1.10)
Potassium: 3.6 mEq/L (ref 3.5–5.1)
Total Bilirubin: 0.3 mg/dL (ref 0.3–1.2)

## 2013-01-11 LAB — CBC WITH DIFFERENTIAL/PLATELET
Basophils Absolute: 0 10*3/uL (ref 0.0–0.1)
Basophils Relative: 0 % (ref 0–1)
Hemoglobin: 10.6 g/dL — ABNORMAL LOW (ref 12.0–15.0)
Lymphocytes Relative: 19 % (ref 12–46)
MCHC: 31.3 g/dL (ref 30.0–36.0)
Monocytes Relative: 8 % (ref 3–12)
Neutro Abs: 5.8 10*3/uL (ref 1.7–7.7)
Neutrophils Relative %: 70 % (ref 43–77)
RBC: 3.53 MIL/uL — ABNORMAL LOW (ref 3.87–5.11)
RDW: 17.1 % — ABNORMAL HIGH (ref 11.5–15.5)
WBC: 8.2 10*3/uL (ref 4.0–10.5)

## 2013-01-11 LAB — TYPE AND SCREEN: ABO/RH(D): O POS

## 2013-01-11 MED ORDER — ONDANSETRON HCL 4 MG/2ML IJ SOLN: 4.0000 mg | Freq: Four times a day (QID) | INTRAMUSCULAR | Status: DC | PRN

## 2013-01-11 MED ORDER — CHOLECALCIFEROL 10 MCG (400 UNIT) PO TABS
400.0000 [IU] | ORAL_TABLET | Freq: Every morning | ORAL | Status: DC
  Administered 2013-01-13: 400 [IU] via ORAL
  Filled 2013-01-11 (×2): qty 1

## 2013-01-11 MED ORDER — SUCRALFATE 1 G PO TABS
1.0000 g | ORAL_TABLET | Freq: Three times a day (TID) | ORAL | Status: DC
  Administered 2013-01-11 – 2013-01-13 (×4): 1 g via ORAL
  Filled 2013-01-11 (×7): qty 1

## 2013-01-11 MED ORDER — SODIUM CHLORIDE 0.9 % IV SOLN
INTRAVENOUS | Status: DC
  Administered 2013-01-11 – 2013-01-12 (×2): via INTRAVENOUS

## 2013-01-11 MED ORDER — POLYETHYLENE GLYCOL 3350 17 GM/SCOOP PO POWD
17.0000 g | Freq: Every day | ORAL | Status: DC
  Filled 2013-01-11: qty 255

## 2013-01-11 MED ORDER — POLYETHYLENE GLYCOL 3350 17 G PO PACK
17.0000 g | PACK | Freq: Every day | ORAL | Status: DC
  Administered 2013-01-13: 17 g via ORAL
  Filled 2013-01-11 (×2): qty 1

## 2013-01-11 MED ORDER — ACETAMINOPHEN 325 MG PO TABS: 650.0000 mg | ORAL_TABLET | Freq: Four times a day (QID) | ORAL | Status: DC | PRN

## 2013-01-11 MED ORDER — DOCUSATE SODIUM 100 MG PO CAPS
100.0000 mg | ORAL_CAPSULE | Freq: Two times a day (BID) | ORAL | Status: DC
  Administered 2013-01-12 – 2013-01-13 (×2): 100 mg via ORAL
  Filled 2013-01-11 (×5): qty 1

## 2013-01-11 MED ORDER — ADULT MULTIVITAMIN W/MINERALS CH
1.0000 | ORAL_TABLET | Freq: Every morning | ORAL | Status: DC
  Administered 2013-01-13: 1 via ORAL
  Filled 2013-01-11 (×2): qty 1

## 2013-01-11 MED ORDER — ZOLPIDEM TARTRATE 5 MG PO TABS
5.0000 mg | ORAL_TABLET | Freq: Every evening | ORAL | Status: DC | PRN
  Administered 2013-01-11 – 2013-01-12 (×2): 5 mg via ORAL
  Filled 2013-01-11 (×2): qty 1

## 2013-01-11 MED ORDER — PANTOPRAZOLE SODIUM 40 MG IV SOLR
40.0000 mg | Freq: Two times a day (BID) | INTRAVENOUS | Status: DC
  Administered 2013-01-11 – 2013-01-13 (×4): 40 mg via INTRAVENOUS
  Filled 2013-01-11 (×5): qty 40

## 2013-01-11 MED ORDER — ACETAMINOPHEN 650 MG RE SUPP: 650.0000 mg | Freq: Four times a day (QID) | RECTAL | Status: DC | PRN

## 2013-01-11 MED ORDER — PANTOPRAZOLE SODIUM 40 MG PO TBEC: 40.0000 mg | DELAYED_RELEASE_TABLET | Freq: Two times a day (BID) | ORAL | Status: DC

## 2013-01-11 MED ORDER — PREGABALIN 25 MG PO CAPS
25.0000 mg | ORAL_CAPSULE | Freq: Every day | ORAL | Status: DC
  Administered 2013-01-11 – 2013-01-12 (×2): 25 mg via ORAL
  Filled 2013-01-11 (×2): qty 1

## 2013-01-11 MED ORDER — ATORVASTATIN CALCIUM 10 MG PO TABS
10.0000 mg | ORAL_TABLET | Freq: Every day | ORAL | Status: DC
  Administered 2013-01-12: 10 mg via ORAL
  Filled 2013-01-11 (×2): qty 1

## 2013-01-11 MED ORDER — ONDANSETRON HCL 4 MG PO TABS: 4.0000 mg | ORAL_TABLET | Freq: Four times a day (QID) | ORAL | Status: DC | PRN

## 2013-01-11 NOTE — H&P (Signed)
[facility] History and Physical  Ashley Chambers AVW:[id number] DOB: 05-26-1930 DOA: 01/11/2013  Referring physician: Dr Wilkie Aye PCP: Tally Joe, MD  Chief Complaint:  Dark stool since one day  HPI:  79 year old female was admitted to the hospital recently for GI bleed secondary to duodenal ulcer for which she had an EGD with tipping and epinephrine injection. She was transfused with 4 units of packed red cells. She returned back 3 days later with lightheadedness and bloody stools. He had a repeat EGD which showed no active bleeding. She also had a colonoscopy which showed small internal hemorrhoids and a few diverticula without any bleed. She was discharged home on PPI and Carafate one week back. She reports that since her discharge she has not had a bowel movement until yesterday and noticed dark tarry stool was yesterday and today. She also felt slightly dizzy and nauseous. Denies any bright red blood, vomiting, hematemesis, abdominal pain, headache, blurred vision, chest pain, palpitations, shortness of breath, urinary symptoms. Denies using NSAIDs. She went to her PCP today who did a rectal exam with positive stool for  occult blood. He spoke with her GI doctor Randa Evens who recommended her to be sent to the hospital and be admitted. As the ED physician she spoke with the on-call GI who recommended patient to be n.p.o. after midnight and be evaluated in the morning. In the ED her hemoglobin was 10.6 which is much better than her hemoglobin upon discharge one week ago of 8.2.  Review of Systems:  Constitutional: Denies fever, chills, diaphoresis, appetite change and fatigue.  HEENT: Denies photophobia, eye pain, redness, hearing loss, ear pain, congestion, sore throat, rhinorrhea, sneezing, mouth sores, trouble swallowing, neck pain, neck stiffness and tinnitus.   Respiratory: Denies SOB, DOE, cough, chest tightness,  and wheezing.   Cardiovascular: Denies chest pain, palpitations and  leg swelling.  Gastrointestinal: Denies nausea, vomiting, abdominal pain, diarrhea, constipation, blood in stool and abdominal distention. Reports dark tarry stool  Genitourinary: Denies dysuria, urgency, frequency, hematuria, flank pain and difficulty urinating.  Musculoskeletal: Denies myalgias, back pain, joint swelling, arthralgias and gait problem.  Neurological: Denies dizziness, seizures, syncope, weakness, light-headedness, numbness and headaches.     Past Medical History  Diagnosis Date  . High cholesterol   . Thyroid disease   . Peptic ulcer    Past Surgical History  Procedure Laterality Date  . Hernia repair    . Tonsillectomy    . Esophagogastroduodenoscopy N/A 12/30/2012    Procedure: ESOPHAGOGASTRODUODENOSCOPY (EGD);  Surgeon: Vertell Novak., MD;  Location: [facility];  Service: Endoscopy;  Laterality: N/A;  . Flexible sigmoidoscopy N/A 12/30/2012    Procedure: FLEXIBLE SIGMOIDOSCOPY;  Surgeon: Vertell Novak., MD;  Location: [facility] ENDOSCOPY;  Service: Endoscopy;  Laterality: N/A;  . Esophagogastroduodenoscopy N/A 01/03/2013    Procedure: ESOPHAGOGASTRODUODENOSCOPY (EGD);  Surgeon: Vertell Novak., MD;  Location: [facility];  Service: Endoscopy;  Laterality: N/A;  . Colonoscopy N/A 01/04/2013    Procedure: COLONOSCOPY;  Surgeon: Petra Kuba, MD;  Location: [facility] ENDOSCOPY;  Service: Endoscopy;  Laterality: N/A;   Social History:  reports that she has never smoked. She does not have any smokeless tobacco history on file. She reports that she does not drink alcohol or use illicit drugs.  Allergies  Allergen Reactions  . Nsaids     Gi bleeding  . Sulfa Antibiotics Other (See Comments)    unknown    Family History  Problem Relation Age of Onset  .  Leukemia Mother   . Aneurysm Father     Prior to Admission medications   Medication Sig Start Date End Date Taking? Authorizing Provider  alendronate (FOSAMAX) 70 MG tablet Take 70 mg by mouth every Sunday.  Take with a full glass of water on an empty stomach.   Yes Historical Provider, MD  Cholecalciferol (VITAMIN D3) 400 UNITS CAPS Take 400 Units by mouth every morning.    Yes Historical Provider, MD  Ferrous Fumarate-DSS TABS Take 1 tablet by mouth 2 (two) times daily. 01/02/13  Yes Vassie Loll, MD  Multiple Vitamin (MULTIVITAMIN WITH MINERALS) TABS tablet Take 1 tablet by mouth every morning.   Yes Historical Provider, MD  Naphazoline HCl (CLEAR EYES OP) Place 2 drops into both eyes daily as needed (For dry eyes.).    Yes Historical Provider, MD  neomycin-bacitracin-polymyxin (NEOSPORIN) ointment Apply 1 application topically daily as needed (For areas on ski). apply to eye   Yes Historical Provider, MD  pantoprazole (PROTONIX) 40 MG tablet Take 1 tablet (40 mg total) by mouth 2 (two) times daily. 01/02/13  Yes Vassie Loll, MD  polyethylene glycol powder (MIRALAX) powder Take 17 g by mouth daily after breakfast.   Yes Historical Provider, MD  pregabalin (LYRICA) 25 MG capsule Take 1 capsule (25 mg total) by mouth at bedtime. 01/02/13  Yes Vassie Loll, MD  rosuvastatin (CRESTOR) 10 MG tablet Take 10 mg by mouth at bedtime.    Yes Historical Provider, MD  sucralfate (CARAFATE) 1 G tablet Take 1 tablet (1 g total) by mouth 3 (three) times daily. 01/02/13  Yes Vassie Loll, MD  traMADol (ULTRAM) 50 MG tablet Take 1 tablet (50 mg total) by mouth every 6 (six) hours as needed for pain. 01/02/13  Yes Vassie Loll, MD  zolpidem (AMBIEN) 5 MG tablet Take 5 mg by mouth at bedtime.   Yes Historical Provider, MD    Physical Exam:  Filed Vitals:   01/11/13 1729 01/11/13 1730 01/11/13 1731 01/11/13 1930  BP: 160/68 144/66 147/56 155/61  Pulse: 70 70 73 73  Temp: 98 F (36.7 C)   98.2 F (36.8 C)  TempSrc: Oral   Oral  Resp: 16   16  SpO2: 97%   100%    Constitutional: Vital signs reviewed. Elderly thin built female lying in bed in no acute distress HEENT: Mild pallor, no icterus, moist oral  mucosa Cardiovascular: RRR, S1 normal, S2 normal, no MRG, Pulmonary/Chest: CTAB, no wheezes, rales, or rhonchi Abdominal: Soft. Non-tender, non-distended, bowel sounds are normal, no masses, organomegaly, or guarding present.  Musculoskeletal: No joint deformities, erythema, or stiffness, ROM full and no nontender Ext: no edema and no cyanosis,  Neurological: A&O x3,   Labs on Admission:  Basic Metabolic Panel:  Recent Labs Lab 01/11/13 1650  NA 138  K 3.6  CL 99  CO2 26  GLUCOSE 132*  BUN 9  CREATININE 0.66  CALCIUM 9.7   Liver Function Tests:  Recent Labs Lab 01/11/13 1650  AST 28  ALT 22  ALKPHOS 58  BILITOT 0.3  PROT 7.7  ALBUMIN 3.9   No results found for this basename: LIPASE, AMYLASE,  in the last 168 hours No results found for this basename: AMMONIA,  in the last 168 hours CBC:  Recent Labs Lab 01/11/13 1650  WBC 8.2  NEUTROABS 5.8  HGB 10.6*  HCT 33.9*  MCV 96.0  PLT 345   Cardiac Enzymes: No results found for this basename: CKTOTAL, CKMB, CKMBINDEX, TROPONINI,  in the last 168 hours BNP: No components found with this basename: POCBNP,  CBG: No results found for this basename: GLUCAP,  in the last 168 hours  Radiological Exams on Admission: No results found.    Assessment/Plan Active Problems:   Heme positive stool Admit under observation. Hemoglobin appears better than her recent level. Given history of recent bleeding duodenal ulcer ee will monitor serial H&H every 6 hours. Place on  PPI IV BID. Continue carafate. Will hold iron supplements. She denies taking NSAIDs at home. I will give her some tears for now and keep her n.p.o. after midnight. [facility] has been informed and will be seeing her tomorrow.  Remaining medical issues are stable. DVT prophylaxis: SCDs Diet: Clears, n.p.o. after midnight    Code Status: Full code Family Communication: Husband and son at bedside Disposition Plan: Home once stable  [facility] Pager [phone]  If 7PM-7AM, please contact night-coverage [email or web address] Password [facility] 01/11/2013, 8:06 PM     Total time spent: 55 minutes

## 2013-01-11 NOTE — ED Notes (Signed)
PER EMS- pt picked up from PCP with c/o rectal bleed.  Pt went to see PCP due to tarry stools x2 days, who advised pt to come to ED to be evaluated. Pt has hx of GI bleeds.  Pt was discharged from the hospital status post peptic ulcer GI bleeding.

## 2013-01-11 NOTE — ED Notes (Signed)
Bed: UE45 Expected date:  Expected time:  Means of arrival:  Comments: ems 79 yo- hx of rectal bleed

## 2013-01-11 NOTE — ED Provider Notes (Signed)
CSN: [id number]     Arrival date & time 01/11/13  1627 History   First MD Initiated Contact with Patient 01/11/13 1629     Chief Complaint  Patient presents with  . Rectal Bleeding   (Consider location/radiation/quality/duration/timing/severity/associated sxs/prior Treatment) HPI This is a 79 year old female with recent history of duodenal ulcer and acute GI bleed who presents with melena. Patient was seen by her primary care physician today for tarry stools for the last 2 days. She's recently admitted twice for acute GI bleed. She had an upper endoscopy and an ulcer was clipped during her first admission. She required 2 units of blood during her first admission as well. Patient reports 2 days of dark stools. She denies any dizziness or syncope. She denies any blood per rectum or hematemesis. She denies any abdominal pain. Her primary care physician evaluated her today and noted tarry stool on rectal exam that was guaiac positive. She was here for further evaluation. Past Medical History  Diagnosis Date  . High cholesterol   . Thyroid disease   . Peptic ulcer    Past Surgical History  Procedure Laterality Date  . Hernia repair    . Tonsillectomy    . Esophagogastroduodenoscopy N/A 12/30/2012    Procedure: ESOPHAGOGASTRODUODENOSCOPY (EGD);  Surgeon: Vertell Novak., MD;  Location: [facility];  Service: Endoscopy;  Laterality: N/A;  . Flexible sigmoidoscopy N/A 12/30/2012    Procedure: FLEXIBLE SIGMOIDOSCOPY;  Surgeon: Vertell Novak., MD;  Location: [facility];  Service: Endoscopy;  Laterality: N/A;  . Esophagogastroduodenoscopy N/A 01/03/2013    Procedure: ESOPHAGOGASTRODUODENOSCOPY (EGD);  Surgeon: Vertell Novak., MD;  Location: [facility];  Service: Endoscopy;  Laterality: N/A;  . Colonoscopy N/A 01/04/2013    Procedure: COLONOSCOPY;  Surgeon: Petra Kuba, MD;  Location: [facility];  Service: Endoscopy;  Laterality: N/A;   Family History  Problem Relation Age of  Onset  . Leukemia Mother   . Aneurysm Father    History  Substance Use Topics  . Smoking status: Never Smoker   . Smokeless tobacco: Not on file  . Alcohol Use: No   OB History   Grav Para Term Preterm Abortions TAB SAB Ect Mult Living                 Review of Systems  Constitutional: Negative for fever.  Respiratory: Negative for cough, chest tightness and shortness of breath.   Cardiovascular: Negative for chest pain.  Gastrointestinal: Positive for blood in stool. Negative for nausea, vomiting, abdominal pain, diarrhea, constipation and rectal pain.  Genitourinary: Negative for dysuria.  Musculoskeletal: Negative for back pain.  Skin: Negative for wound.  Neurological: Negative for dizziness and syncope.  Psychiatric/Behavioral: Negative for confusion.  All other systems reviewed and are negative.    Allergies  Nsaids and Sulfa antibiotics  Home Medications  No current outpatient prescriptions on file. BP 128/73  Pulse 74  Temp(Src) 97.9 F (36.6 C) (Oral)  Resp 16  Ht 5\' 6"  (1.676 m)  Wt 131 lb 3.2 oz (59.512 kg)  BMI 21.19 kg/m2  SpO2 98% Physical Exam  Nursing note and vitals reviewed. Constitutional: She is oriented to person, place, and time. She appears well-developed and well-nourished.  HENT:  Head: Normocephalic and atraumatic.  Eyes: Pupils are equal, round, and reactive to light.  Neck: Neck supple.  Cardiovascular: Normal rate, regular rhythm and normal heart sounds.   Pulmonary/Chest: Effort normal and breath sounds normal. No respiratory distress. She has no wheezes.  Abdominal: Soft. Bowel sounds are normal. She exhibits no distension. There is no tenderness.  Genitourinary:  Deferred given recent documented rectal exam a primary care physician  Neurological: She is alert and oriented to person, place, and time.  Skin: Skin is warm and dry.  Psychiatric: She has a normal mood and affect.    ED Course  Procedures (including critical care  time) Labs Review Labs Reviewed  CBC WITH DIFFERENTIAL - Abnormal; Notable for the following:    RBC 3.53 (*)    Hemoglobin 10.6 (*)    HCT 33.9 (*)    RDW 17.1 (*)    All other components within normal limits  COMPREHENSIVE METABOLIC PANEL - Abnormal; Notable for the following:    Glucose, Bld 132 (*)    GFR calc non Af Amer 82 (*)    All other components within normal limits  CBC  CBC  TYPE AND SCREEN   Imaging Review No results found.  MDM   1. GI bleed   2. Heme positive stool     this is a 79 year old female who presents with tarry stools for 2 days. She has a recent history of acute GI bleed. She is of note taking iron. However, she was noted to have guaiac positive stools at her primary care office. She is hemodynamically stable. CBC, BMP, and type and screen were obtained. CBC shows a hemoglobin of 10.6 which is up from 7 proximally one week ago. I spoke to the on-call GI specialist for [facility] who recommends admitting the patient for further evaluation tomorrow and possible EGD. Patient will be admitted to the hospitalist service.   Shon Baton, MD 01/11/13 [phone]

## 2013-01-12 ENCOUNTER — Encounter (HOSPITAL_COMMUNITY)
Admission: EM | Disposition: A | Discharge status: Home / Self Care | Payer: Self-pay | Source: Home / Self Care | Attending: Internal Medicine

## 2013-01-12 ENCOUNTER — Observation Stay (HOSPITAL_COMMUNITY): Payer: Medicare Other

## 2013-01-12 ENCOUNTER — Encounter (HOSPITAL_COMMUNITY): Payer: Self-pay | Admitting: *Deleted

## 2013-01-12 DIAGNOSIS — K922 Gastrointestinal hemorrhage, unspecified: Secondary | ICD-10-CM

## 2013-01-12 DIAGNOSIS — D649 Anemia, unspecified: Secondary | ICD-10-CM | POA: Diagnosis present

## 2013-01-12 HISTORY — PX: ESOPHAGOGASTRODUODENOSCOPY: SHX5428

## 2013-01-12 LAB — CBC
HCT: 27.5 % — ABNORMAL LOW (ref 36.0–46.0)
HCT: 28.3 % — ABNORMAL LOW (ref 36.0–46.0)
Hemoglobin: 9 g/dL — ABNORMAL LOW (ref 12.0–15.0)
Hemoglobin: 9.2 g/dL — ABNORMAL LOW (ref 12.0–15.0)
MCH: 30.3 pg (ref 26.0–34.0)
MCH: 30.3 pg (ref 26.0–34.0)
MCH: 30.7 pg (ref 26.0–34.0)
MCHC: 32.5 g/dL (ref 30.0–36.0)
MCHC: 32.2 g/dL (ref 30.0–36.0)
MCV: 92.6 fL (ref 78.0–100.0)
MCV: 93.9 fL (ref 78.0–100.0)
MCV: 94.3 fL (ref 78.0–100.0)
Platelets: 284 10*3/uL (ref 150–400)
Platelets: 299 10*3/uL (ref 150–400)
RBC: 2.97 MIL/uL — ABNORMAL LOW (ref 3.87–5.11)
RBC: 3 MIL/uL — ABNORMAL LOW (ref 3.87–5.11)
RBC: 3.14 MIL/uL — ABNORMAL LOW (ref 3.87–5.11)
RDW: 15.9 % — ABNORMAL HIGH (ref 11.5–15.5)
WBC: 6.9 10*3/uL (ref 4.0–10.5)

## 2013-01-12 SURGERY — EGD (ESOPHAGOGASTRODUODENOSCOPY)
Anesthesia: Moderate Sedation

## 2013-01-12 MED ORDER — MIDAZOLAM HCL 10 MG/2ML IJ SOLN
INTRAMUSCULAR | Status: AC
  Filled 2013-01-12: qty 2

## 2013-01-12 MED ORDER — FENTANYL CITRATE 0.05 MG/ML IJ SOLN
INTRAMUSCULAR | Status: AC
  Filled 2013-01-12: qty 2

## 2013-01-12 MED ORDER — SODIUM CHLORIDE 0.9 % IV SOLN: INTRAVENOUS | Status: DC

## 2013-01-12 MED ORDER — GUAIFENESIN 100 MG/5ML PO SOLN
200.0000 mg | ORAL | Status: DC | PRN
  Administered 2013-01-12 – 2013-01-13 (×2): 200 mg via ORAL
  Filled 2013-01-12 (×2): qty 10

## 2013-01-12 MED ORDER — MIDAZOLAM HCL 10 MG/2ML IJ SOLN
INTRAMUSCULAR | Status: DC | PRN
  Administered 2013-01-12: 1 mg via INTRAVENOUS
  Administered 2013-01-12: 2 mg via INTRAVENOUS

## 2013-01-12 MED ORDER — BUTAMBEN-TETRACAINE-BENZOCAINE 2-2-14 % EX AERO
INHALATION_SPRAY | CUTANEOUS | Status: DC | PRN
  Administered 2013-01-12: 2 via TOPICAL

## 2013-01-12 MED ORDER — FENTANYL CITRATE 0.05 MG/ML IJ SOLN
INTRAMUSCULAR | Status: DC | PRN
  Administered 2013-01-12 (×2): 25 ug via INTRAVENOUS

## 2013-01-12 NOTE — Progress Notes (Signed)
[facility] PROGRESS NOTE  JAPJI KOK WUJ:[id number] DOB: 09-May-1930 DOA: 01/11/2013 PCP: Sissy Hoff, MD  HPI/Brief narrative 79 year old female with history of recent GI bleed due to duodenal ulcer, status post EGD last week for rebleed on 01/03/13 and Hemoclip placed on bleeding duodenal ulcer on 12/30/12 was intact without further bleeding. Colonoscopy showed diverticulosis and small internal hemorrhoids without bleeding. Since discharge, she had no BMs until 3 days ago when she started having several episodes of oozing black stools when she was upright and stop taking iron supplements that she had just started. No nausea, vomiting or abdominal pain. Slightly dizzy at times. Seen by her PCP were she was heme positive and advised to come to the ED. Since admission, hemoglobin has mostly been stable. GI was consulted and performed EGD-no overt bleeding seen.   Assessment/Plan:  Melena - Probably secondary to retained recent upper GI bleed - As per EGD, no active bleeding. - Recent duodenal ulcer status post Hemoclip-stable by EGD - Twice a day PPI - Avoid NSAIDs - Soft diet and advance as tolerated. - KUB without signs of fecal impaction  Anemia  - Secondary to recent acute blood loss - Stable - Follow CBC in a.m. - Consider starting iron supplements on DC   DVT prophylaxis: SCDs Lines/catheters: PIV Nutrition: Soft diet and advance as tolerated  Activity:  Up with assistance Code Status: Full Family Communication: Discussed with spouse at bedside Disposition Plan: Home possibly 9/24   Consultants:  Gastroenterology  Procedures:  EGD 9/23  Antibiotics:  None   Subjective: Patient was seen this morning prior to EGD. Spotting of small loose black stools on underwear.  Objective: Filed Vitals:   01/12/13 1430 01/12/13 1500 01/12/13 1600 01/12/13 1700  BP: 118/59 118/50 106/46 114/48  Pulse: 73 76 76 81  Temp: 98.7 F (37.1 C) 98.4 F (36.9 C) 97.6  F (36.4 C) 98.3 F (36.8 C)  TempSrc: Oral Oral Oral Oral  Resp: 18 18 18 18   Height:      Weight:      SpO2: 97% 97% 98% 97%    Intake/Output Summary (Last 24 hours) at 01/12/13 1750 Last data filed at 01/12/13 1408  Gross per 24 hour  Intake 856.25 ml  Output      0 ml  Net 856.25 ml   Filed Weights   01/11/13 1930  Weight: 59.512 kg (131 lb 3.2 oz)     Exam:  General exam: Moderately built and nourished elderly female lying comfortably supine in bed. Respiratory system: Clear. No increased work of breathing. Cardiovascular system: S1 & S2 heard, RRR. No JVD, murmurs, gallops, clicks or pedal edema. Gastrointestinal system: Abdomen is nondistended, soft and nontender. Normal bowel sounds heard. Central nervous system: Alert and oriented. No focal neurological deficits. Extremities: Symmetric 5 x 5 power.   Data Reviewed: Basic Metabolic Panel:  Recent Labs Lab 01/11/13 1650  NA 138  K 3.6  CL 99  CO2 26  GLUCOSE 132*  BUN 9  CREATININE 0.66  CALCIUM 9.7   Liver Function Tests:  Recent Labs Lab 01/11/13 1650  AST 28  ALT 22  ALKPHOS 58  BILITOT 0.3  PROT 7.7  ALBUMIN 3.9   No results found for this basename: LIPASE, AMYLASE,  in the last 168 hours No results found for this basename: AMMONIA,  in the last 168 hours CBC:  Recent Labs Lab 01/11/13 1650 01/11/13 2334 01/12/13 0805  WBC 8.2 6.9 5.0  NEUTROABS 5.8  --   --  HGB 10.6* 9.0* 9.2*  HCT 33.9* 27.5* 28.3*  MCV 96.0 92.6 94.3  PLT 345 284 284   Cardiac Enzymes: No results found for this basename: CKTOTAL, CKMB, CKMBINDEX, TROPONINI,  in the last 168 hours BNP (last 3 results) No results found for this basename: PROBNP,  in the last 8760 hours CBG: No results found for this basename: GLUCAP,  in the last 168 hours  No results found for this or any previous visit (from the past 240 hour(s)).    Additional labs: 1. None     Studies: Dg Abd Portable 1v  01/12/2013    *RADIOLOGY REPORT*  Clinical Data: Evaluate for constipation.  PORTABLE ABDOMEN - 1 VIEW  Comparison: None.  Findings: Mildly prominent small bowel gas on the left without significant distention.  Only slight stool burden mainly in the hepatic flexure.  No apparent rectosigmoid constipation or fecal impaction.  Degenerative change L4-L5 disc space.  Surgical clips right upper quadrant.  Vascular calcification.  IMPRESSION: No evidence for fecal impaction. Mildly prominent small bowel gas over the left abdomen without signs of obstruction.   Original Report Authenticated By: Davonna Belling, M.D.        Scheduled Meds: . atorvastatin  10 mg Oral q1800  . cholecalciferol  400 Units Oral q morning - 10a  . docusate sodium  100 mg Oral BID  . multivitamin with minerals  1 tablet Oral q morning - 10a  . pantoprazole (PROTONIX) IV  40 mg Intravenous Q12H  . polyethylene glycol  17 g Oral Daily  . pregabalin  25 mg Oral QHS  . sucralfate  1 g Oral TID   Continuous Infusions: . sodium chloride 75 mL/hr at 01/12/13 1013    Active Problems:   Heme positive stool   GI bleed    Time spent: 30 minutes.    [facility]  [facility] Pager [phone].   If 8PM-8AM, please contact night-coverage at [email or web address], password [facility] 01/12/2013, 5:50 PM  LOS: 1 day

## 2013-01-12 NOTE — Consult Note (Signed)
Referring Provider: Dr. Hongalgi Primary Care Physician:  SWAYNE,DAVID W, MD Primary Gastroenterologist:  Unassigned  Reason for Consultation:  Melena; GI bleed; History of Duodenal ulcer  HPI: Ashley Chambers is a 79 y.o. female with recent GIB due to a duodenal ulcer and s/p EGD last week for rebleed on 01/03/13 and hemoclip placed on bleeding duodenal ulcer on 12/30/12 was intact without further bleeding. Clean-based small gastric ulcers seen initially as well. Colonoscopy also done that showed diverticulosis and small internal hemorrhoids without bleeding. No BM after discharge until this past Sunday when she had several episodes of black stools. No stools yesterday. Reports gurgling of abdomen but denies abd pain. Mild nausea without vomiting. Slightly dizzy. Denies NSAIDs since initial bleed when she had been using aspirin chronically. Heme positive at her doctor's office and advised to go to ER. Reports spotting of black stools today. Hgb 10.6 in ER yesterday and now 9.2. Hgb was 8.2 at discharge last week. Husband and son are in the room. She is hungry and wants to eat.     Past Medical History  Diagnosis Date  . High cholesterol   . Thyroid disease   . Peptic ulcer     Past Surgical History  Procedure Laterality Date  . Hernia repair    . Tonsillectomy    . Esophagogastroduodenoscopy N/A 12/30/2012    Procedure: ESOPHAGOGASTRODUODENOSCOPY (EGD);  Surgeon: James L Edwards Jr., MD;  Location: [facility];  Service: Endoscopy;  Laterality: N/A;  . Flexible sigmoidoscopy N/A 12/30/2012    Procedure: FLEXIBLE SIGMOIDOSCOPY;  Surgeon: James L Edwards Jr., MD;  Location: [facility];  Service: Endoscopy;  Laterality: N/A;  . Esophagogastroduodenoscopy N/A 01/03/2013    Procedure: ESOPHAGOGASTRODUODENOSCOPY (EGD);  Surgeon: James L Edwards Jr., MD;  Location: [facility];  Service: Endoscopy;  Laterality: N/A;  . Colonoscopy N/A 01/04/2013    Procedure: COLONOSCOPY;  Surgeon: Marc E  Magod, MD;  Location: [facility];  Service: Endoscopy;  Laterality: N/A;    Prior to Admission medications   Medication Sig Start Date End Date Taking? Authorizing Provider  alendronate (FOSAMAX) 70 MG tablet Take 70 mg by mouth every Sunday. Take with a full glass of water on an empty stomach.   Yes Historical Provider, MD  Cholecalciferol (VITAMIN D3) 400 UNITS CAPS Take 400 Units by mouth every morning.    Yes Historical Provider, MD  Ferrous Fumarate-DSS TABS Take 1 tablet by mouth 2 (two) times daily. 01/02/13  Yes Carlos Madera, MD  Multiple Vitamin (MULTIVITAMIN WITH MINERALS) TABS tablet Take 1 tablet by mouth every morning.   Yes Historical Provider, MD  Naphazoline HCl (CLEAR EYES OP) Place 2 drops into both eyes daily as needed (For dry eyes.).    Yes Historical Provider, MD  neomycin-bacitracin-polymyxin (NEOSPORIN) ointment Apply 1 application topically daily as needed (For areas on ski). apply to eye   Yes Historical Provider, MD  pantoprazole (PROTONIX) 40 MG tablet Take 1 tablet (40 mg total) by mouth 2 (two) times daily. 01/02/13  Yes Carlos Madera, MD  polyethylene glycol powder (MIRALAX) powder Take 17 g by mouth daily after breakfast.   Yes Historical Provider, MD  pregabalin (LYRICA) 25 MG capsule Take 1 capsule (25 mg total) by mouth at bedtime. 01/02/13  Yes Carlos Madera, MD  rosuvastatin (CRESTOR) 10 MG tablet Take 10 mg by mouth at bedtime.    Yes Historical Provider, MD  sucralfate (CARAFATE) 1 G tablet Take 1 tablet (1 g total) by mouth 3 (three) times daily.   01/02/13  Yes Carlos Madera, MD  traMADol (ULTRAM) 50 MG tablet Take 1 tablet (50 mg total) by mouth every 6 (six) hours as needed for pain. 01/02/13  Yes Carlos Madera, MD  zolpidem (AMBIEN) 5 MG tablet Take 5 mg by mouth at bedtime.   Yes Historical Provider, MD    Scheduled Meds: . atorvastatin  10 mg Oral q1800  . cholecalciferol  400 Units Oral q morning - 10a  . docusate sodium  100 mg Oral BID  .  multivitamin with minerals  1 tablet Oral q morning - 10a  . pantoprazole (PROTONIX) IV  40 mg Intravenous Q12H  . polyethylene glycol  17 g Oral Daily  . pregabalin  25 mg Oral QHS  . sucralfate  1 g Oral TID   Continuous Infusions: . sodium chloride 75 mL/hr at 01/12/13 1013  . sodium chloride Stopped (01/12/13 1013)   PRN Meds:.acetaminophen, acetaminophen, guaifenesin, ondansetron (ZOFRAN) IV, ondansetron, zolpidem  Allergies as of 01/11/2013 - Review Complete 01/11/2013  Allergen Reaction Noted  . Nsaids  01/02/2013  . Sulfa antibiotics Other (See Comments) 12/28/2012    Family History  Problem Relation Age of Onset  . Leukemia Mother   . Aneurysm Father     History   Social History  . Marital Status: Married    Spouse Name: N/A    Number of Children: N/A  . Years of Education: N/A   Occupational History  . Not on file.   Social History Main Topics  . Smoking status: Never Smoker   . Smokeless tobacco: Not on file  . Alcohol Use: No  . Drug Use: No  . Sexual Activity: Not on file   Other Topics Concern  . Not on file   Social History Narrative  . No narrative on file    Review of Systems: All negative except as stated above in HPI.  Physical Exam: Vital signs: Filed Vitals:   01/12/13 0528  BP: 122/60  Pulse: 63  Temp: 97.9 F (36.6 C)  Resp: 16   Last BM Date: 01/11/13 General:   Alert,  Elderly, Well-developed, well-nourished, pleasant and cooperative in NAD HEENT: anicteric Neck: supple, nontender Lungs:  Clear throughout to auscultation.   No wheezes, crackles, or rhonchi. No acute distress. Heart:  Regular rate and rhythm; no murmurs, clicks, rubs,  or gallops. Abdomen: soft, NT, ND, +BS  Rectal:  Deferred Ext: no edema  GI:  Lab Results:  Recent Labs  01/11/13 1650 01/11/13 2334 01/12/13 0805  WBC 8.2 6.9 5.0  HGB 10.6* 9.0* 9.2*  HCT 33.9* 27.5* 28.3*  PLT 345 284 284   BMET  Recent Labs  01/11/13 1650  NA 138  K  3.6  CL 99  CO2 26  GLUCOSE 132*  BUN 9  CREATININE 0.66  CALCIUM 9.7   LFT  Recent Labs  01/11/13 1650  PROT 7.7  ALBUMIN 3.9  AST 28  ALT 22  ALKPHOS 58  BILITOT 0.3   PT/INR No results found for this basename: LABPROT, INR,  in the last 72 hours   Studies/Results: Dg Abd Portable 1v  01/12/2013   *RADIOLOGY REPORT*  Clinical Data: Evaluate for constipation.  PORTABLE ABDOMEN - 1 VIEW  Comparison: None.  Findings: Mildly prominent small bowel gas on the left without significant distention.  Only slight stool burden mainly in the hepatic flexure.  No apparent rectosigmoid constipation or fecal impaction.  Degenerative change L4-L5 disc space.  Surgical clips right upper quadrant.    Vascular calcification.  IMPRESSION: No evidence for fecal impaction. Mildly prominent small bowel gas over the left abdomen without signs of obstruction.   Original Report Authenticated By: John Curnes, M.D.    Impression/Plan: 79 yo with recent melena in the setting of a recent duodenal ulcer bleed that was likely due to previous NSAID use. Needs repeat EGD to assess her duodenal ulcer and see whether the hemoclip is attached but suspect recent melena was residual blood. NPO. PPI IV. If no bleeding on EGD, then advance diet and d/c tomorrow morning.    LOS: 1 day   Delailah Spieth C.  01/12/2013, 10:41 AM   

## 2013-01-12 NOTE — H&P (View-Only) (Signed)
Referring Provider: Dr. Waymon Amato Primary Care Physician:  Sissy Hoff, MD Primary Gastroenterologist:  Gentry Fitz  Reason for Consultation:  Melena; GI bleed; History of Duodenal ulcer  HPI: Ashley Chambers is a 79 y.o. female with recent GIB due to a duodenal ulcer and s/p EGD last week for rebleed on 01/03/13 and hemoclip placed on bleeding duodenal ulcer on 12/30/12 was intact without further bleeding. Clean-based small gastric ulcers seen initially as well. Colonoscopy also done that showed diverticulosis and small internal hemorrhoids without bleeding. No BM after discharge until this past Sunday when she had several episodes of black stools. No stools yesterday. Reports gurgling of abdomen but denies abd pain. Mild nausea without vomiting. Slightly dizzy. Denies NSAIDs since initial bleed when she had been using aspirin chronically. Heme positive at her doctor's office and advised to go to ER. Reports spotting of black stools today. Hgb 10.6 in ER yesterday and now 9.2. Hgb was 8.2 at discharge last week. Husband and son are in the room. She is hungry and wants to eat.     Past Medical History  Diagnosis Date  . High cholesterol   . Thyroid disease   . Peptic ulcer     Past Surgical History  Procedure Laterality Date  . Hernia repair    . Tonsillectomy    . Esophagogastroduodenoscopy N/A 12/30/2012    Procedure: ESOPHAGOGASTRODUODENOSCOPY (EGD);  Surgeon: Vertell Novak., MD;  Location: [facility];  Service: Endoscopy;  Laterality: N/A;  . Flexible sigmoidoscopy N/A 12/30/2012    Procedure: FLEXIBLE SIGMOIDOSCOPY;  Surgeon: Vertell Novak., MD;  Location: [facility];  Service: Endoscopy;  Laterality: N/A;  . Esophagogastroduodenoscopy N/A 01/03/2013    Procedure: ESOPHAGOGASTRODUODENOSCOPY (EGD);  Surgeon: Vertell Novak., MD;  Location: [facility];  Service: Endoscopy;  Laterality: N/A;  . Colonoscopy N/A 01/04/2013    Procedure: COLONOSCOPY;  Surgeon: Petra Kuba, MD;  Location: [facility];  Service: Endoscopy;  Laterality: N/A;    Prior to Admission medications   Medication Sig Start Date End Date Taking? Authorizing Provider  alendronate (FOSAMAX) 70 MG tablet Take 70 mg by mouth every Sunday. Take with a full glass of water on an empty stomach.   Yes Historical Provider, MD  Cholecalciferol (VITAMIN D3) 400 UNITS CAPS Take 400 Units by mouth every morning.    Yes Historical Provider, MD  Ferrous Fumarate-DSS TABS Take 1 tablet by mouth 2 (two) times daily. 01/02/13  Yes Vassie Loll, MD  Multiple Vitamin (MULTIVITAMIN WITH MINERALS) TABS tablet Take 1 tablet by mouth every morning.   Yes Historical Provider, MD  Naphazoline HCl (CLEAR EYES OP) Place 2 drops into both eyes daily as needed (For dry eyes.).    Yes Historical Provider, MD  neomycin-bacitracin-polymyxin (NEOSPORIN) ointment Apply 1 application topically daily as needed (For areas on ski). apply to eye   Yes Historical Provider, MD  pantoprazole (PROTONIX) 40 MG tablet Take 1 tablet (40 mg total) by mouth 2 (two) times daily. 01/02/13  Yes Vassie Loll, MD  polyethylene glycol powder (MIRALAX) powder Take 17 g by mouth daily after breakfast.   Yes Historical Provider, MD  pregabalin (LYRICA) 25 MG capsule Take 1 capsule (25 mg total) by mouth at bedtime. 01/02/13  Yes Vassie Loll, MD  rosuvastatin (CRESTOR) 10 MG tablet Take 10 mg by mouth at bedtime.    Yes Historical Provider, MD  sucralfate (CARAFATE) 1 G tablet Take 1 tablet (1 g total) by mouth 3 (three) times daily.  01/02/13  Yes Vassie Loll, MD  traMADol (ULTRAM) 50 MG tablet Take 1 tablet (50 mg total) by mouth every 6 (six) hours as needed for pain. 01/02/13  Yes Vassie Loll, MD  zolpidem (AMBIEN) 5 MG tablet Take 5 mg by mouth at bedtime.   Yes Historical Provider, MD    Scheduled Meds: . atorvastatin  10 mg Oral q1800  . cholecalciferol  400 Units Oral q morning - 10a  . docusate sodium  100 mg Oral BID  .  multivitamin with minerals  1 tablet Oral q morning - 10a  . pantoprazole (PROTONIX) IV  40 mg Intravenous Q12H  . polyethylene glycol  17 g Oral Daily  . pregabalin  25 mg Oral QHS  . sucralfate  1 g Oral TID   Continuous Infusions: . sodium chloride 75 mL/hr at 01/12/13 1013  . sodium chloride Stopped (01/12/13 1013)   PRN Meds:.acetaminophen, acetaminophen, guaifenesin, ondansetron (ZOFRAN) IV, ondansetron, zolpidem  Allergies as of 01/11/2013 - Review Complete 01/11/2013  Allergen Reaction Noted  . Nsaids  01/02/2013  . Sulfa antibiotics Other (See Comments) 12/28/2012    Family History  Problem Relation Age of Onset  . Leukemia Mother   . Aneurysm Father     History   Social History  . Marital Status: Married    Spouse Name: N/A    Number of Children: N/A  . Years of Education: N/A   Occupational History  . Not on file.   Social History Main Topics  . Smoking status: Never Smoker   . Smokeless tobacco: Not on file  . Alcohol Use: No  . Drug Use: No  . Sexual Activity: Not on file   Other Topics Concern  . Not on file   Social History Narrative  . No narrative on file    Review of Systems: All negative except as stated above in HPI.  Physical Exam: Vital signs: Filed Vitals:   01/12/13 0528  BP: 122/60  Pulse: 63  Temp: 97.9 F (36.6 C)  Resp: 16   Last BM Date: 01/11/13 General:   Alert,  Elderly, Well-developed, well-nourished, pleasant and cooperative in NAD HEENT: anicteric Neck: supple, nontender Lungs:  Clear throughout to auscultation.   No wheezes, crackles, or rhonchi. No acute distress. Heart:  Regular rate and rhythm; no murmurs, clicks, rubs,  or gallops. Abdomen: soft, NT, ND, +BS  Rectal:  Deferred Ext: no edema  GI:  Lab Results:  Recent Labs  01/11/13 1650 01/11/13 2334 01/12/13 0805  WBC 8.2 6.9 5.0  HGB 10.6* 9.0* 9.2*  HCT 33.9* 27.5* 28.3*  PLT 345 284 284   BMET  Recent Labs  01/11/13 1650  NA 138  K  3.6  CL 99  CO2 26  GLUCOSE 132*  BUN 9  CREATININE 0.66  CALCIUM 9.7   LFT  Recent Labs  01/11/13 1650  PROT 7.7  ALBUMIN 3.9  AST 28  ALT 22  ALKPHOS 58  BILITOT 0.3   PT/INR No results found for this basename: LABPROT, INR,  in the last 72 hours   Studies/Results: Dg Abd Portable 1v  01/12/2013   *RADIOLOGY REPORT*  Clinical Data: Evaluate for constipation.  PORTABLE ABDOMEN - 1 VIEW  Comparison: None.  Findings: Mildly prominent small bowel gas on the left without significant distention.  Only slight stool burden mainly in the hepatic flexure.  No apparent rectosigmoid constipation or fecal impaction.  Degenerative change L4-L5 disc space.  Surgical clips right upper quadrant.  Vascular calcification.  IMPRESSION: No evidence for fecal impaction. Mildly prominent small bowel gas over the left abdomen without signs of obstruction.   Original Report Authenticated By: Davonna Belling, M.D.    Impression/Plan: 79 yo with recent melena in the setting of a recent duodenal ulcer bleed that was likely due to previous NSAID use. Needs repeat EGD to assess her duodenal ulcer and see whether the hemoclip is attached but suspect recent melena was residual blood. NPO. PPI IV. If no bleeding on EGD, then advance diet and d/c tomorrow morning.    LOS: 1 day   Ashaunte Standley C.  01/12/2013, 10:41 AM

## 2013-01-12 NOTE — Care Management Note (Signed)
    Page 1 of 1   01/12/2013     3:17:50 PM   CARE MANAGEMENT NOTE 01/12/2013  Patient:  Ashley Chambers, Ashley Chambers   Account Number:  [id number]  Date Initiated:  01/12/2013  Documentation initiated by:  Lorenda Ishihara  Subjective/Objective Assessment:   79 yo female admitted s/p gib. PTA lived at home iwht spouse.     Action/Plan:   Home when stable after EGD.   Anticipated DC Date:  01/13/2013   Anticipated DC Plan:  HOME/SELF CARE      DC Planning Services  CM consult      Choice offered to / List presented to:             Status of service:  Completed, signed off Medicare Important Message given?   (If response is "NO", the following Medicare IM given date fields will be blank) Date Medicare IM given:   Date Additional Medicare IM given:    Discharge Disposition:  HOME/SELF CARE  Per UR Regulation:  Reviewed for med. necessity/level of care/duration of stay  If discussed at Long Length of Stay Meetings, dates discussed:    Comments:

## 2013-01-12 NOTE — Interval H&P Note (Signed)
History and Physical Interval Note:  01/12/2013 1:02 PM  Ashley Chambers  has presented today for surgery, with the diagnosis of melena  The various methods of treatment have been discussed with the patient and family. After consideration of risks, benefits and other options for treatment, the patient has consented to  Procedure(s): ESOPHAGOGASTRODUODENOSCOPY (EGD) (N/A) as a surgical intervention .  The patient's history has been reviewed, patient examined, no change in status, stable for surgery.  I have reviewed the patient's chart and labs.  Questions were answered to the patient's satisfaction.     Riannon Mukherjee C.

## 2013-01-12 NOTE — Op Note (Signed)
[facility] [address] [place], [address]   ENDOSCOPY PROCEDURE REPORT  PATIENT: Ashley, Chambers  MR#: [id number] BIRTHDATE: [birth year] , 81  yrs. old GENDER: Female  ENDOSCOPIST: Charlott Rakes, MD REFERRED [id number] team  PROCEDURE DATE:  01/12/2013 PROCEDURE:   EGD, diagnostic ASA CLASS:   Class III INDICATIONS:Melena. MEDICATIONS: Fentanyl 50 mcg IV, Versed 4 mg IV, and Cetacaine spray x 2  TOPICAL ANESTHETIC:  DESCRIPTION OF PROCEDURE:   After the risks benefits and alternatives of the procedure were thoroughly explained, informed consent was obtained.  The Pentax Gastroscope [id number]  endoscope was introduced through the mouth and advanced to the second portion of the duodenum , limited by Without limitations.   The instrument was slowly withdrawn as the mucosa was fully examined.     FINDINGS: The endoscope was inserted into the oropharynx and esophagus was intubated.  The gastroesophageal junction was noted to be 40 cm from the incisors. The GEJ was mildly inflamed consistent with erosive esophagitis.  Endoscope was advanced into the stomach, which revealed previously noted shallow clean-based antral ulcers. The endoscope was advanced to the duodenal bulb, where the previously placed hemoclip was placed and was intact without any visible ulcer or bleeding stigmata seen. The endoscope was not advanced distal to the hemoclip to avoid dislodging it so the second portion of duodenum was not visualized. The endoscope was withdrawn back into the stomach and retroflexion revealed a small hiatal hernia.  COMPLICATIONS: None  ENDOSCOPIC IMPRESSION:     1. Small hiatal hernia 2. Hemoclip in duodenum without bleeding stigmata 3. Small clean-based gastric ulcers 4. Mild erosive esophagitis  RECOMMENDATIONS: Soft diet and advance; Avoid NSAIDs; IV PPI Q 12 hours; Home on PPI PO BID   REPEAT  EXAM: N/A  _______________________________ Charlott Rakes, MD eSigned:  Charlott Rakes, MD 01/12/2013 1:32 PM    CC:  PATIENT NAME:  Ashley, Chambers MR#: [id number]

## 2013-01-13 ENCOUNTER — Encounter (HOSPITAL_COMMUNITY): Payer: Self-pay | Admitting: Gastroenterology

## 2013-01-13 DIAGNOSIS — K269 Duodenal ulcer, unspecified as acute or chronic, without hemorrhage or perforation: Secondary | ICD-10-CM

## 2013-01-13 LAB — CBC
HCT: 28.9 % — ABNORMAL LOW (ref 36.0–46.0)
MCH: 30.3 pg (ref 26.0–34.0)
MCHC: 32.5 g/dL (ref 30.0–36.0)
MCV: 93.2 fL (ref 78.0–100.0)
Platelets: 298 10*3/uL (ref 150–400)
RDW: 16 % — ABNORMAL HIGH (ref 11.5–15.5)

## 2013-01-13 NOTE — Discharge Summary (Signed)
[facility]  Physician Discharge Summary   Patient ID: Ashley Chambers MRN: [id number] DOB/AGE: [birth year] 79 y.o.  Admit date: 01/11/2013 Discharge date: 01/13/2013  PCP: Sissy Hoff, MD  DISCHARGE DIAGNOSES:  Active Problems:   Heme positive stool   Anemia   Duodenal ulcer   RECOMMENDATIONS FOR OUTPATIENT FOLLOW UP: 1. Patient to follow up with Dr. Randa Evens.   DISCHARGE CONDITION: fair  Diet recommendation: Low Sodium, Avoid fried foods.  Filed Weights   01/11/13 1930  Weight: 59.512 kg (131 lb 3.2 oz)    INITIAL HISTORY: 79 year old female with history of recent GI bleed due to duodenal ulcer, status post EGD last week for rebleed on 01/03/13 and Hemoclip placed on bleeding duodenal ulcer on 12/30/12 was intact without further bleeding. Colonoscopy showed diverticulosis and small internal hemorrhoids without bleeding. Since discharge, she had no BMs until 3 days prior to admission when she started having several episodes of oozing black stools when she was upright and stopped taking iron supplements that she had just started. No nausea, vomiting or abdominal pain. Was slightly dizzy at times. Seen by her PCP where she was heme positive and advised to come to the ED. Since admission, hemoglobin has mostly been stable. GI was consulted and performed EGD-no overt bleeding seen.  Consultations:  [facility]  Procedures: EGD 9/23 1. Small hiatal hernia  2. Hemoclip in duodenum without bleeding stigmata  3. Small clean-based gastric ulcers  4. Mild erosive esophagitis  HOSPITAL COURSE:   Melena  Probably secondary to retained recent upper GI bleed. As per EGD, no active bleeding. Recent duodenal ulcer status post Hemoclip-stable by EGD. Patient to continue Twice a day PPI. Avoid NSAIDs. Tolerating diet. To avoid spicy/fried foods. Avoid coffee/tea intake.   Anemia  Secondary to recent acute blood loss. Stable hgb.   Overall she remains stable. No drop in Hgb. EGD  is reassuring. Patient's questions were answered. She is stable for discharge. She will need close follow up with Dr. Randa Evens.   PERTINENT LABS:  The results of significant diagnostics from this hospitalization (including imaging, microbiology, ancillary and laboratory) are listed below for reference.     Labs: Basic Metabolic Panel:  Recent Labs Lab 01/11/13 1650  NA 138  K 3.6  CL 99  CO2 26  GLUCOSE 132*  BUN 9  CREATININE 0.66  CALCIUM 9.7   Liver Function Tests:  Recent Labs Lab 01/11/13 1650  AST 28  ALT 22  ALKPHOS 58  BILITOT 0.3  PROT 7.7  ALBUMIN 3.9   CBC:  Recent Labs Lab 01/11/13 1650 01/11/13 2334 01/12/13 0805 01/12/13 1750 01/13/13 0444  WBC 8.2 6.9 5.0 5.5 6.2  NEUTROABS 5.8  --   --   --   --   HGB 10.6* 9.0* 9.2* 9.5* 9.4*  HCT 33.9* 27.5* 28.3* 29.5* 28.9*  MCV 96.0 92.6 94.3 93.9 93.2  PLT 345 284 284 299 298    IMAGING STUDIES Dg Abd Portable 1v  01/12/2013   *RADIOLOGY REPORT*  Clinical Data: Evaluate for constipation.  PORTABLE ABDOMEN - 1 VIEW  Comparison: None.  Findings: Mildly prominent small bowel gas on the left without significant distention.  Only slight stool burden mainly in the hepatic flexure.  No apparent rectosigmoid constipation or fecal impaction.  Degenerative change L4-L5 disc space.  Surgical clips right upper quadrant.  Vascular calcification.  IMPRESSION: No evidence for fecal impaction. Mildly prominent small bowel gas over the left abdomen without signs of obstruction.   Original  Report Authenticated By: Davonna Belling, M.D.    DISCHARGE EXAMINATION: Filed Vitals:   01/12/13 1700 01/12/13 1824 01/12/13 2230 01/13/13 0609  BP: 114/48 112/52 128/71 139/73  Pulse: 81 77 69 67  Temp: 98.3 F (36.8 C) 98.2 F (36.8 C) 97.9 F (36.6 C) 98.3 F (36.8 C)  TempSrc: Oral Oral Oral Oral  Resp: 18 18 18 19   Height:      Weight:      SpO2: 97% 99% 97% 96%   General appearance: alert, cooperative, appears stated  age and no distress Resp: clear to auscultation bilaterally Cardio: regular rate and rhythm, S1, S2 normal, no murmur, click, rub or gallop GI: soft, non-tender; bowel sounds normal; no masses,  no organomegaly Neurologic: No focal weakness.  DISPOSITION: Home  Discharge Orders   Future Orders Complete By Expires   Diet - low sodium heart healthy  As directed    Discharge instructions  As directed    Comments:     Take Fosamax while sitting up and with water as instructed. Remain upright for after having that medications. Please be sure to see Dr. Randa Evens in follow up.   Increase activity slowly  As directed       ALLERGIES:  Allergies  Allergen Reactions  . Nsaids     Gi bleeding  . Sulfa Antibiotics Other (See Comments)    unknown    Current Discharge Medication List    CONTINUE these medications which have NOT CHANGED   Details  alendronate (FOSAMAX) 70 MG tablet Take 70 mg by mouth every Sunday. Take with a full glass of water on an empty stomach.    Cholecalciferol (VITAMIN D3) 400 UNITS CAPS Take 400 Units by mouth every morning.     Ferrous Fumarate-DSS TABS Take 1 tablet by mouth 2 (two) times daily. Qty: 60 tablet, Refills: 1    Multiple Vitamin (MULTIVITAMIN WITH MINERALS) TABS tablet Take 1 tablet by mouth every morning.    Naphazoline HCl (CLEAR EYES OP) Place 2 drops into both eyes daily as needed (For dry eyes.).     neomycin-bacitracin-polymyxin (NEOSPORIN) ointment Apply 1 application topically daily as needed (For areas on ski). apply to eye    pantoprazole (PROTONIX) 40 MG tablet Take 1 tablet (40 mg total) by mouth 2 (two) times daily. Qty: 60 tablet, Refills: 1    polyethylene glycol powder (MIRALAX) powder Take 17 g by mouth daily after breakfast.    pregabalin (LYRICA) 25 MG capsule Take 1 capsule (25 mg total) by mouth at bedtime. Qty: 30 capsule, Refills: 1    rosuvastatin (CRESTOR) 10 MG tablet Take 10 mg by mouth at bedtime.      sucralfate (CARAFATE) 1 G tablet Take 1 tablet (1 g total) by mouth 3 (three) times daily. Qty: 90 tablet, Refills: 1    traMADol (ULTRAM) 50 MG tablet Take 1 tablet (50 mg total) by mouth every 6 (six) hours as needed for pain. Qty: 40 tablet, Refills: 0    zolpidem (AMBIEN) 5 MG tablet Take 5 mg by mouth at bedtime.       Follow-up Information   Follow up with Sissy Hoff, MD.   Specialty:  Family Medicine   Contact information:   [address] [place] [address] [phone]       Follow up with EDWARDS JR,JAMES L, MD. Schedule an appointment as soon as possible for a visit in 1 week. (please call for appointment)    Specialty:  Gastroenterology  Contact information:   [address]                         [place] [address] [phone]       TOTAL DISCHARGE TIME: 35 mins  [facility]  [facility] Pager [phone]  01/13/2013, 11:25 AM

## 2013-02-04 ENCOUNTER — Other Ambulatory Visit (HOSPITAL_BASED_OUTPATIENT_CLINIC_OR_DEPARTMENT_OTHER): Payer: Self-pay | Admitting: Family Medicine

## 2013-02-04 DIAGNOSIS — Z1231 Encounter for screening mammogram for malignant neoplasm of breast: Secondary | ICD-10-CM

## 2013-02-25 ENCOUNTER — Other Ambulatory Visit: Payer: Self-pay

## 2013-03-05 ENCOUNTER — Ambulatory Visit (HOSPITAL_BASED_OUTPATIENT_CLINIC_OR_DEPARTMENT_OTHER): Payer: Medicare Other

## 2013-03-15 ENCOUNTER — Ambulatory Visit (HOSPITAL_BASED_OUTPATIENT_CLINIC_OR_DEPARTMENT_OTHER)
Admission: RE | Admit: 2013-03-15 | Discharge: 2013-03-15 | Disposition: A | Discharge status: Home / Self Care | Payer: Medicare Other | Source: Ambulatory Visit | Attending: Family Medicine | Admitting: Family Medicine

## 2013-03-15 DIAGNOSIS — Z1231 Encounter for screening mammogram for malignant neoplasm of breast: Secondary | ICD-10-CM

## 2013-05-25 ENCOUNTER — Other Ambulatory Visit: Payer: Self-pay | Admitting: Neurological Surgery

## 2013-05-25 DIAGNOSIS — M549 Dorsalgia, unspecified: Secondary | ICD-10-CM

## 2013-05-31 ENCOUNTER — Ambulatory Visit
Admission: RE | Admit: 2013-05-31 | Discharge: 2013-05-31 | Disposition: A | Discharge status: Home / Self Care | Payer: Medicare Other | Source: Ambulatory Visit | Attending: Neurological Surgery | Admitting: Neurological Surgery

## 2013-05-31 VITALS — BP 118/55 | HR 73

## 2013-05-31 DIAGNOSIS — M549 Dorsalgia, unspecified: Secondary | ICD-10-CM

## 2013-05-31 MED ORDER — IOHEXOL 180 MG/ML  SOLN
15.0000 mL | Freq: Once | INTRAMUSCULAR | Status: AC | PRN
  Administered 2013-05-31: 15 mL via INTRATHECAL

## 2013-05-31 MED ORDER — DIAZEPAM 5 MG PO TABS
10.0000 mg | ORAL_TABLET | Freq: Once | ORAL | Status: AC
  Administered 2013-05-31: 5 mg via ORAL

## 2013-05-31 NOTE — Discharge Instructions (Signed)
Myelogram Discharge Instructions  1. Go home and rest quietly for the next 24 hours.  It is important to lie flat for the next 24 hours.  Get up only to go to the restroom.  You may lie in the bed or on a couch on your back, your stomach, your left side or your right side.  You may have one pillow under your head.  You may have pillows between your knees while you are on your side or under your knees while you are on your back.  2. DO NOT drive today.  Recline the seat as far back as it will go, while still wearing your seat belt, on the way home.  3. You may get up to go to the bathroom as needed.  You may sit up for 10 minutes to eat.  You may resume your normal diet and medications unless otherwise indicated.  Drink lots of extra fluids today and tomorrow.  4. The incidence of headache, nausea, or vomiting is about 5% (one in 20 patients).  If you develop a headache, lie flat and drink plenty of fluids until the headache goes away.  Caffeinated beverages may be helpful.  If you develop severe nausea and vomiting or a headache that does not go away with flat bed rest, call [phone].  5. You may resume normal activities after your 24 hours of bed rest is over; however, do not exert yourself strongly or do any heavy lifting tomorrow. If when you get up you have a headache when standing, go back to bed and force fluids for another 24 hours.  6. Call your physician for a follow-up appointment.  The results of your myelogram will be sent directly to your physician by the following day.  7. If you have any questions or if complications develop after you arrive home, please call [phone].  Discharge instructions have been explained to the patient.  The patient, or the person responsible for the patient, fully understands these instructions.      May resume tramadol on Feb. 10, 2015, after 11:00 am.

## 2013-06-02 ENCOUNTER — Telehealth: Payer: Self-pay

## 2013-06-02 NOTE — Telephone Encounter (Signed)
Returned husband's phone call regarding his wife feeling weak, having hot and cold spells and being anorexic since her myelogram here 05/31/13.  He states she continues to take the valium at bedtime Dr. Benard Rinkurnes prescribed for 24 hours (until patient could resume Tramadol).  She has not resumed Tramadol yet, per husband.  Told husband to have patient resume Tramadol, especially since she seemed to be having withdrawal symptoms 05/31/13, having been off this medication 48 hours pre-procedure.  He stated he would do this and stop the valium.  He would call primary MD if symptoms persist.  jkl

## 2014-03-01 ENCOUNTER — Other Ambulatory Visit (HOSPITAL_BASED_OUTPATIENT_CLINIC_OR_DEPARTMENT_OTHER): Payer: Self-pay | Admitting: Family Medicine

## 2014-03-01 DIAGNOSIS — Z1231 Encounter for screening mammogram for malignant neoplasm of breast: Secondary | ICD-10-CM

## 2014-03-21 ENCOUNTER — Ambulatory Visit (HOSPITAL_BASED_OUTPATIENT_CLINIC_OR_DEPARTMENT_OTHER): Payer: Medicare Other

## 2014-03-21 ENCOUNTER — Ambulatory Visit (HOSPITAL_BASED_OUTPATIENT_CLINIC_OR_DEPARTMENT_OTHER)
Admission: RE | Admit: 2014-03-21 | Discharge: 2014-03-21 | Disposition: A | Discharge status: Home / Self Care | Payer: Medicare Other | Source: Ambulatory Visit | Attending: Family Medicine | Admitting: Family Medicine

## 2014-03-21 DIAGNOSIS — Z1231 Encounter for screening mammogram for malignant neoplasm of breast: Secondary | ICD-10-CM | POA: Diagnosis not present

## 2014-03-25 ENCOUNTER — Ambulatory Visit (HOSPITAL_BASED_OUTPATIENT_CLINIC_OR_DEPARTMENT_OTHER): Payer: Medicare Other

## 2014-08-12 ENCOUNTER — Other Ambulatory Visit (HOSPITAL_COMMUNITY): Payer: Self-pay | Admitting: Family Medicine

## 2014-08-12 ENCOUNTER — Ambulatory Visit (HOSPITAL_COMMUNITY): Payer: Medicare Other | Attending: Cardiology | Admitting: Cardiology

## 2014-08-12 DIAGNOSIS — R011 Cardiac murmur, unspecified: Secondary | ICD-10-CM

## 2014-08-12 DIAGNOSIS — E785 Hyperlipidemia, unspecified: Secondary | ICD-10-CM | POA: Insufficient documentation

## 2014-08-12 NOTE — Progress Notes (Signed)
Echo performed. 

## 2014-08-15 ENCOUNTER — Encounter (HOSPITAL_COMMUNITY): Payer: Self-pay | Admitting: Family Medicine

## 2015-04-13 ENCOUNTER — Other Ambulatory Visit (HOSPITAL_BASED_OUTPATIENT_CLINIC_OR_DEPARTMENT_OTHER): Payer: Self-pay | Admitting: Family Medicine

## 2015-04-13 DIAGNOSIS — Z1231 Encounter for screening mammogram for malignant neoplasm of breast: Secondary | ICD-10-CM

## 2015-04-18 ENCOUNTER — Ambulatory Visit (HOSPITAL_BASED_OUTPATIENT_CLINIC_OR_DEPARTMENT_OTHER)
Admission: RE | Admit: 2015-04-18 | Discharge: 2015-04-18 | Disposition: A | Discharge status: Home / Self Care | Payer: Medicare Other | Source: Ambulatory Visit | Attending: Family Medicine | Admitting: Family Medicine

## 2015-04-18 DIAGNOSIS — Z1231 Encounter for screening mammogram for malignant neoplasm of breast: Secondary | ICD-10-CM | POA: Insufficient documentation

## 2015-08-18 DIAGNOSIS — Z79891 Long term (current) use of opiate analgesic: Secondary | ICD-10-CM | POA: Diagnosis not present

## 2015-08-18 DIAGNOSIS — G894 Chronic pain syndrome: Secondary | ICD-10-CM | POA: Diagnosis not present

## 2015-08-18 DIAGNOSIS — M4806 Spinal stenosis, lumbar region: Secondary | ICD-10-CM | POA: Diagnosis not present

## 2015-08-18 DIAGNOSIS — M5136 Other intervertebral disc degeneration, lumbar region: Secondary | ICD-10-CM | POA: Diagnosis not present

## 2015-08-22 DIAGNOSIS — K219 Gastro-esophageal reflux disease without esophagitis: Secondary | ICD-10-CM | POA: Diagnosis not present

## 2015-08-22 DIAGNOSIS — M4800 Spinal stenosis, site unspecified: Secondary | ICD-10-CM | POA: Diagnosis not present

## 2015-08-22 DIAGNOSIS — R011 Cardiac murmur, unspecified: Secondary | ICD-10-CM | POA: Diagnosis not present

## 2015-08-22 DIAGNOSIS — G4700 Insomnia, unspecified: Secondary | ICD-10-CM | POA: Diagnosis not present

## 2015-08-22 DIAGNOSIS — E782 Mixed hyperlipidemia: Secondary | ICD-10-CM | POA: Diagnosis not present

## 2015-08-22 DIAGNOSIS — M810 Age-related osteoporosis without current pathological fracture: Secondary | ICD-10-CM | POA: Diagnosis not present

## 2015-11-03 DIAGNOSIS — C44729 Squamous cell carcinoma of skin of left lower limb, including hip: Secondary | ICD-10-CM | POA: Diagnosis not present

## 2015-11-03 DIAGNOSIS — Z85828 Personal history of other malignant neoplasm of skin: Secondary | ICD-10-CM | POA: Diagnosis not present

## 2016-02-05 DIAGNOSIS — Z23 Encounter for immunization: Secondary | ICD-10-CM | POA: Diagnosis not present

## 2016-02-16 DIAGNOSIS — Z23 Encounter for immunization: Secondary | ICD-10-CM | POA: Diagnosis not present

## 2016-02-22 DIAGNOSIS — L821 Other seborrheic keratosis: Secondary | ICD-10-CM | POA: Diagnosis not present

## 2016-02-22 DIAGNOSIS — L720 Epidermal cyst: Secondary | ICD-10-CM | POA: Diagnosis not present

## 2016-02-22 DIAGNOSIS — Z85828 Personal history of other malignant neoplasm of skin: Secondary | ICD-10-CM | POA: Diagnosis not present

## 2016-03-26 DIAGNOSIS — K219 Gastro-esophageal reflux disease without esophagitis: Secondary | ICD-10-CM | POA: Diagnosis not present

## 2016-03-26 DIAGNOSIS — Z1389 Encounter for screening for other disorder: Secondary | ICD-10-CM | POA: Diagnosis not present

## 2016-03-26 DIAGNOSIS — R011 Cardiac murmur, unspecified: Secondary | ICD-10-CM | POA: Diagnosis not present

## 2016-03-26 DIAGNOSIS — M810 Age-related osteoporosis without current pathological fracture: Secondary | ICD-10-CM | POA: Diagnosis not present

## 2016-03-26 DIAGNOSIS — G4700 Insomnia, unspecified: Secondary | ICD-10-CM | POA: Diagnosis not present

## 2016-03-26 DIAGNOSIS — M4800 Spinal stenosis, site unspecified: Secondary | ICD-10-CM | POA: Diagnosis not present

## 2016-03-26 DIAGNOSIS — E782 Mixed hyperlipidemia: Secondary | ICD-10-CM | POA: Diagnosis not present

## 2016-03-26 DIAGNOSIS — Z0000 Encounter for general adult medical examination without abnormal findings: Secondary | ICD-10-CM | POA: Diagnosis not present

## 2016-04-18 ENCOUNTER — Other Ambulatory Visit (HOSPITAL_BASED_OUTPATIENT_CLINIC_OR_DEPARTMENT_OTHER): Payer: Self-pay | Admitting: Family Medicine

## 2016-04-18 DIAGNOSIS — Z1231 Encounter for screening mammogram for malignant neoplasm of breast: Secondary | ICD-10-CM

## 2016-04-23 ENCOUNTER — Ambulatory Visit (HOSPITAL_BASED_OUTPATIENT_CLINIC_OR_DEPARTMENT_OTHER)
Admission: RE | Admit: 2016-04-23 | Discharge: 2016-04-23 | Disposition: A | Discharge status: Home / Self Care | Payer: Medicare Other | Source: Ambulatory Visit | Attending: Family Medicine | Admitting: Family Medicine

## 2016-04-23 DIAGNOSIS — Z1231 Encounter for screening mammogram for malignant neoplasm of breast: Secondary | ICD-10-CM | POA: Insufficient documentation

## 2016-05-14 DIAGNOSIS — M810 Age-related osteoporosis without current pathological fracture: Secondary | ICD-10-CM | POA: Diagnosis not present

## 2016-06-20 DIAGNOSIS — H5203 Hypermetropia, bilateral: Secondary | ICD-10-CM | POA: Diagnosis not present

## 2016-06-20 DIAGNOSIS — H25093 Other age-related incipient cataract, bilateral: Secondary | ICD-10-CM | POA: Diagnosis not present

## 2016-06-20 DIAGNOSIS — H524 Presbyopia: Secondary | ICD-10-CM | POA: Diagnosis not present

## 2016-06-20 DIAGNOSIS — H04123 Dry eye syndrome of bilateral lacrimal glands: Secondary | ICD-10-CM | POA: Diagnosis not present

## 2016-10-01 DIAGNOSIS — G4700 Insomnia, unspecified: Secondary | ICD-10-CM | POA: Diagnosis not present

## 2016-10-01 DIAGNOSIS — M810 Age-related osteoporosis without current pathological fracture: Secondary | ICD-10-CM | POA: Diagnosis not present

## 2016-10-01 DIAGNOSIS — M4800 Spinal stenosis, site unspecified: Secondary | ICD-10-CM | POA: Diagnosis not present

## 2016-10-01 DIAGNOSIS — E782 Mixed hyperlipidemia: Secondary | ICD-10-CM | POA: Diagnosis not present

## 2016-12-14 ENCOUNTER — Emergency Department (HOSPITAL_BASED_OUTPATIENT_CLINIC_OR_DEPARTMENT_OTHER)
Admission: EM | Admit: 2016-12-14 | Discharge: 2016-12-14 | Disposition: A | Discharge status: Home / Self Care | Payer: No Typology Code available for payment source | Attending: Emergency Medicine | Admitting: Emergency Medicine

## 2016-12-14 ENCOUNTER — Emergency Department (HOSPITAL_BASED_OUTPATIENT_CLINIC_OR_DEPARTMENT_OTHER): Payer: No Typology Code available for payment source

## 2016-12-14 ENCOUNTER — Encounter (HOSPITAL_BASED_OUTPATIENT_CLINIC_OR_DEPARTMENT_OTHER): Payer: Self-pay

## 2016-12-14 DIAGNOSIS — Y999 Unspecified external cause status: Secondary | ICD-10-CM | POA: Diagnosis not present

## 2016-12-14 DIAGNOSIS — Y939 Activity, unspecified: Secondary | ICD-10-CM | POA: Insufficient documentation

## 2016-12-14 DIAGNOSIS — S3991XA Unspecified injury of abdomen, initial encounter: Secondary | ICD-10-CM | POA: Diagnosis not present

## 2016-12-14 DIAGNOSIS — S29001A Unspecified injury of muscle and tendon of front wall of thorax, initial encounter: Secondary | ICD-10-CM | POA: Diagnosis present

## 2016-12-14 DIAGNOSIS — S299XXA Unspecified injury of thorax, initial encounter: Secondary | ICD-10-CM | POA: Diagnosis not present

## 2016-12-14 DIAGNOSIS — S2220XA Unspecified fracture of sternum, initial encounter for closed fracture: Secondary | ICD-10-CM | POA: Diagnosis not present

## 2016-12-14 DIAGNOSIS — M25562 Pain in left knee: Secondary | ICD-10-CM | POA: Diagnosis not present

## 2016-12-14 DIAGNOSIS — M25552 Pain in left hip: Secondary | ICD-10-CM | POA: Diagnosis not present

## 2016-12-14 DIAGNOSIS — S8991XA Unspecified injury of right lower leg, initial encounter: Secondary | ICD-10-CM | POA: Diagnosis not present

## 2016-12-14 DIAGNOSIS — R109 Unspecified abdominal pain: Secondary | ICD-10-CM | POA: Diagnosis not present

## 2016-12-14 DIAGNOSIS — R0789 Other chest pain: Secondary | ICD-10-CM | POA: Diagnosis not present

## 2016-12-14 DIAGNOSIS — R079 Chest pain, unspecified: Secondary | ICD-10-CM

## 2016-12-14 DIAGNOSIS — Z79899 Other long term (current) drug therapy: Secondary | ICD-10-CM | POA: Diagnosis not present

## 2016-12-14 DIAGNOSIS — M25561 Pain in right knee: Secondary | ICD-10-CM | POA: Diagnosis not present

## 2016-12-14 DIAGNOSIS — Y929 Unspecified place or not applicable: Secondary | ICD-10-CM | POA: Insufficient documentation

## 2016-12-14 LAB — COMPREHENSIVE METABOLIC PANEL
ALBUMIN: 4.5 g/dL (ref 3.5–5.0)
ALT: 29 U/L (ref 14–54)
AST: 31 U/L (ref 15–41)
Alkaline Phosphatase: 52 U/L (ref 38–126)
Anion gap: 8 (ref 5–15)
BUN: 19 mg/dL (ref 6–20)
CHLORIDE: 103 mmol/L (ref 101–111)
CO2: 26 mmol/L (ref 22–32)
CREATININE: 0.73 mg/dL (ref 0.44–1.00)
Calcium: 9.5 mg/dL (ref 8.9–10.3)
GFR calc Af Amer: >60 mL/min (ref >60)
GFR calc non Af Amer: >60 mL/min (ref >60)
Glucose, Bld: 126 mg/dL — ABNORMAL HIGH (ref 65–99)
POTASSIUM: 4.2 mmol/L (ref 3.5–5.1)
SODIUM: 137 mmol/L (ref 135–145)
Total Bilirubin: 0.7 mg/dL (ref 0.3–1.2)
Total Protein: 7.8 g/dL (ref 6.5–8.1)

## 2016-12-14 LAB — URINALYSIS, MICROSCOPIC (REFLEX): RBC / HPF: NONE SEEN RBC/hpf (ref 0–5)

## 2016-12-14 LAB — URINALYSIS, ROUTINE W REFLEX MICROSCOPIC
BILIRUBIN URINE: NEGATIVE
GLUCOSE, UA: NEGATIVE mg/dL
HGB URINE DIPSTICK: NEGATIVE
KETONES UR: 15 mg/dL — AB
Nitrite: NEGATIVE
PH: 6 (ref 5.0–8.0)
PROTEIN: NEGATIVE mg/dL
Specific Gravity, Urine: 1.018 (ref 1.005–1.030)

## 2016-12-14 LAB — CBC WITH DIFFERENTIAL/PLATELET
Basophils Absolute: 0 10*3/uL (ref 0.0–0.1)
Basophils Relative: 0 %
Eosinophils Absolute: 0.1 10*3/uL (ref 0.0–0.7)
Eosinophils Relative: 0 %
HCT: 43.4 % (ref 36.0–46.0)
Hemoglobin: 14.6 g/dL (ref 12.0–15.0)
LYMPHS ABS: 1 10*3/uL (ref 0.7–4.0)
LYMPHS PCT: 7 %
MCH: 30.5 pg (ref 26.0–34.0)
MCHC: 33.6 g/dL (ref 30.0–36.0)
MCV: 90.8 fL (ref 78.0–100.0)
MONOS PCT: 5 %
Monocytes Absolute: 0.7 10*3/uL (ref 0.1–1.0)
NEUTROS ABS: 12.4 10*3/uL — AB (ref 1.7–7.7)
NEUTROS PCT: 88 %
PLATELETS: 191 10*3/uL (ref 150–400)
RBC: 4.78 MIL/uL (ref 3.87–5.11)
RDW: 13.4 % (ref 11.5–15.5)
WBC: 14.2 10*3/uL — ABNORMAL HIGH (ref 4.0–10.5)

## 2016-12-14 LAB — PROTIME-INR
INR: 1
Prothrombin Time: 13.2 seconds (ref 11.4–15.2)

## 2016-12-14 LAB — I-STAT CG4 LACTIC ACID, ED: LACTIC ACID, VENOUS: 1.95 mmol/L — AB (ref 0.5–1.9)

## 2016-12-14 LAB — LIPASE, BLOOD: Lipase: 20 U/L (ref 11–51)

## 2016-12-14 LAB — TROPONIN I

## 2016-12-14 MED ORDER — OXYCODONE-ACETAMINOPHEN 5-325 MG PO TABS: 1.0000 | ORAL_TABLET | Freq: Four times a day (QID) | ORAL | 0 refills | Status: DC | PRN

## 2016-12-14 MED ORDER — MORPHINE SULFATE (PF) 4 MG/ML IV SOLN
4.0000 mg | Freq: Once | INTRAVENOUS | Status: AC
  Administered 2016-12-14: 4 mg via INTRAVENOUS
  Filled 2016-12-14: qty 1

## 2016-12-14 MED ORDER — IOPAMIDOL (ISOVUE-300) INJECTION 61%
100.0000 mL | Freq: Once | INTRAVENOUS | Status: AC | PRN
  Administered 2016-12-14: 100 mL via INTRAVENOUS

## 2016-12-14 MED ORDER — FENTANYL CITRATE (PF) 100 MCG/2ML IJ SOLN
50.0000 ug | Freq: Once | INTRAMUSCULAR | Status: AC
  Administered 2016-12-14: 50 ug via INTRAVENOUS
  Filled 2016-12-14: qty 2
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
## 2016-12-14 NOTE — ED Notes (Signed)
Pt assisted to bedside commode with assistance of RN and EMT.

## 2016-12-14 NOTE — ED Notes (Signed)
Patient transported to CT 

## 2016-12-14 NOTE — ED Notes (Signed)
Called Pt's name for Radiology in ER waiting room, no answer. Will check back later

## 2016-12-14 NOTE — ED Notes (Signed)
Per radiology protocol  Based on pts age, must wait  For bun/creat prior to exam with iv contrast

## 2016-12-14 NOTE — ED Notes (Signed)
EMT went in to take patient to the bathroom. Patient was not corporative to get up. Pt kept saying " I need my leg down so I can get up but don't move my leg". Patient didn't want EMT to help,she Stated she" wanted EMT to go get her more pain medication to help take the pain fully away." RN made aware.

## 2016-12-14 NOTE — ED Notes (Signed)
Alert, NAD, calm, interactive, resps e/u, speaking in clear complete sentences, no dyspnea noted, skin W&D, VSS, c/o L hip pain and sternal pain, improved, (denies: sob, nausea, dizziness or visual changes). EDP and RT into room at time of d/c. IS teaching done. Family x3 at [facility].

## 2016-12-16 DIAGNOSIS — E041 Nontoxic single thyroid nodule: Secondary | ICD-10-CM | POA: Diagnosis not present

## 2016-12-16 DIAGNOSIS — Z1389 Encounter for screening for other disorder: Secondary | ICD-10-CM | POA: Diagnosis not present

## 2016-12-16 DIAGNOSIS — S2222XA Fracture of body of sternum, initial encounter for closed fracture: Secondary | ICD-10-CM | POA: Diagnosis not present

## 2016-12-17 DIAGNOSIS — D649 Anemia, unspecified: Secondary | ICD-10-CM | POA: Diagnosis not present

## 2016-12-17 DIAGNOSIS — M79652 Pain in left thigh: Secondary | ICD-10-CM | POA: Diagnosis not present

## 2016-12-17 DIAGNOSIS — S2222XD Fracture of body of sternum, subsequent encounter for fracture with routine healing: Secondary | ICD-10-CM | POA: Diagnosis not present

## 2016-12-17 DIAGNOSIS — E785 Hyperlipidemia, unspecified: Secondary | ICD-10-CM | POA: Diagnosis not present

## 2016-12-17 DIAGNOSIS — S2220XD Unspecified fracture of sternum, subsequent encounter for fracture with routine healing: Secondary | ICD-10-CM | POA: Diagnosis not present

## 2016-12-18 ENCOUNTER — Other Ambulatory Visit: Payer: Self-pay | Admitting: Family Medicine

## 2016-12-18 DIAGNOSIS — E041 Nontoxic single thyroid nodule: Secondary | ICD-10-CM

## 2017-01-28 ENCOUNTER — Ambulatory Visit
Admission: RE | Admit: 2017-01-28 | Discharge: 2017-01-28 | Disposition: A | Discharge status: Home / Self Care | Payer: Medicare Other | Source: Ambulatory Visit | Attending: Family Medicine | Admitting: Family Medicine

## 2017-01-28 DIAGNOSIS — E041 Nontoxic single thyroid nodule: Secondary | ICD-10-CM

## 2017-01-28 DIAGNOSIS — Z23 Encounter for immunization: Secondary | ICD-10-CM | POA: Diagnosis not present

## 2017-01-30 DIAGNOSIS — E041 Nontoxic single thyroid nodule: Secondary | ICD-10-CM | POA: Diagnosis not present

## 2017-01-30 DIAGNOSIS — K5900 Constipation, unspecified: Secondary | ICD-10-CM | POA: Diagnosis not present

## 2017-01-30 DIAGNOSIS — M4800 Spinal stenosis, site unspecified: Secondary | ICD-10-CM | POA: Diagnosis not present

## 2017-01-31 ENCOUNTER — Ambulatory Visit
Admission: RE | Admit: 2017-01-31 | Discharge: 2017-01-31 | Disposition: A | Discharge status: Home / Self Care | Payer: Medicare Other | Source: Ambulatory Visit | Attending: Family Medicine | Admitting: Family Medicine

## 2017-01-31 ENCOUNTER — Other Ambulatory Visit: Payer: Self-pay | Admitting: Family Medicine

## 2017-01-31 DIAGNOSIS — K5900 Constipation, unspecified: Secondary | ICD-10-CM

## 2017-01-31 DIAGNOSIS — R1030 Lower abdominal pain, unspecified: Secondary | ICD-10-CM | POA: Diagnosis not present

## 2017-02-04 DIAGNOSIS — S2220XB Unspecified fracture of sternum, initial encounter for open fracture: Secondary | ICD-10-CM | POA: Diagnosis not present

## 2017-02-04 DIAGNOSIS — M5430 Sciatica, unspecified side: Secondary | ICD-10-CM | POA: Diagnosis not present

## 2017-02-05 DIAGNOSIS — E041 Nontoxic single thyroid nodule: Secondary | ICD-10-CM | POA: Diagnosis not present

## 2017-02-06 ENCOUNTER — Other Ambulatory Visit: Payer: Self-pay | Admitting: Endocrinology

## 2017-02-06 DIAGNOSIS — E041 Nontoxic single thyroid nodule: Secondary | ICD-10-CM

## 2017-02-07 DIAGNOSIS — M545 Low back pain: Secondary | ICD-10-CM | POA: Diagnosis not present

## 2017-02-07 DIAGNOSIS — M5137 Other intervertebral disc degeneration, lumbosacral region: Secondary | ICD-10-CM | POA: Diagnosis not present

## 2017-02-26 ENCOUNTER — Inpatient Hospital Stay
Admission: RE | Admit: 2017-02-26 | Discharge: 2017-02-26 | Disposition: A | Discharge status: Home / Self Care | Payer: Medicare Other | Source: Ambulatory Visit | Attending: Endocrinology | Admitting: Endocrinology

## 2017-02-26 ENCOUNTER — Other Ambulatory Visit (HOSPITAL_COMMUNITY)
Admission: RE | Admit: 2017-02-26 | Discharge: 2017-02-26 | Disposition: A | Discharge status: Home / Self Care | Payer: Medicare Other | Source: Ambulatory Visit | Attending: Radiology | Admitting: Radiology

## 2017-02-26 ENCOUNTER — Ambulatory Visit
Admission: RE | Admit: 2017-02-26 | Discharge: 2017-02-26 | Disposition: A | Discharge status: Home / Self Care | Payer: Medicare Other | Source: Ambulatory Visit | Attending: Endocrinology | Admitting: Endocrinology

## 2017-02-26 DIAGNOSIS — E041 Nontoxic single thyroid nodule: Secondary | ICD-10-CM | POA: Diagnosis not present

## 2017-03-27 DIAGNOSIS — G4700 Insomnia, unspecified: Secondary | ICD-10-CM | POA: Diagnosis not present

## 2017-03-27 DIAGNOSIS — M810 Age-related osteoporosis without current pathological fracture: Secondary | ICD-10-CM | POA: Diagnosis not present

## 2017-03-27 DIAGNOSIS — Z0000 Encounter for general adult medical examination without abnormal findings: Secondary | ICD-10-CM | POA: Diagnosis not present

## 2017-03-27 DIAGNOSIS — E782 Mixed hyperlipidemia: Secondary | ICD-10-CM | POA: Diagnosis not present

## 2017-04-10 DIAGNOSIS — H6123 Impacted cerumen, bilateral: Secondary | ICD-10-CM | POA: Diagnosis not present

## 2017-09-09 ENCOUNTER — Other Ambulatory Visit (HOSPITAL_BASED_OUTPATIENT_CLINIC_OR_DEPARTMENT_OTHER): Payer: Self-pay | Admitting: Family Medicine

## 2017-09-09 DIAGNOSIS — Z1231 Encounter for screening mammogram for malignant neoplasm of breast: Secondary | ICD-10-CM

## 2017-09-17 ENCOUNTER — Ambulatory Visit (HOSPITAL_BASED_OUTPATIENT_CLINIC_OR_DEPARTMENT_OTHER)
Admission: RE | Admit: 2017-09-17 | Discharge: 2017-09-17 | Disposition: A | Discharge status: Home / Self Care | Payer: Medicare Other | Source: Ambulatory Visit | Attending: Family Medicine | Admitting: Family Medicine

## 2017-09-17 DIAGNOSIS — Z1231 Encounter for screening mammogram for malignant neoplasm of breast: Secondary | ICD-10-CM | POA: Diagnosis not present

## 2017-10-15 DIAGNOSIS — H524 Presbyopia: Secondary | ICD-10-CM | POA: Diagnosis not present

## 2017-10-15 DIAGNOSIS — H5203 Hypermetropia, bilateral: Secondary | ICD-10-CM | POA: Diagnosis not present

## 2017-10-15 DIAGNOSIS — H25093 Other age-related incipient cataract, bilateral: Secondary | ICD-10-CM | POA: Diagnosis not present

## 2017-11-07 DIAGNOSIS — G4700 Insomnia, unspecified: Secondary | ICD-10-CM | POA: Diagnosis not present

## 2017-11-07 DIAGNOSIS — E782 Mixed hyperlipidemia: Secondary | ICD-10-CM | POA: Diagnosis not present

## 2017-11-07 DIAGNOSIS — M4800 Spinal stenosis, site unspecified: Secondary | ICD-10-CM | POA: Diagnosis not present

## 2017-11-07 DIAGNOSIS — M810 Age-related osteoporosis without current pathological fracture: Secondary | ICD-10-CM | POA: Diagnosis not present

## 2017-11-11 ENCOUNTER — Emergency Department (HOSPITAL_COMMUNITY): Payer: Medicare Other

## 2017-11-11 ENCOUNTER — Other Ambulatory Visit: Payer: Self-pay

## 2017-11-11 ENCOUNTER — Encounter (HOSPITAL_COMMUNITY): Payer: Self-pay

## 2017-11-11 ENCOUNTER — Observation Stay (HOSPITAL_COMMUNITY)
Admission: EM | Admit: 2017-11-11 | Discharge: 2017-11-14 | Disposition: A | Discharge status: Skilled Nursing Facility | Payer: Medicare Other | Attending: General Surgery | Admitting: General Surgery

## 2017-11-11 DIAGNOSIS — R0789 Other chest pain: Secondary | ICD-10-CM | POA: Diagnosis not present

## 2017-11-11 DIAGNOSIS — S6991XA Unspecified injury of right wrist, hand and finger(s), initial encounter: Secondary | ICD-10-CM | POA: Diagnosis not present

## 2017-11-11 DIAGNOSIS — M25561 Pain in right knee: Secondary | ICD-10-CM | POA: Diagnosis not present

## 2017-11-11 DIAGNOSIS — S2222XA Fracture of body of sternum, initial encounter for closed fracture: Secondary | ICD-10-CM

## 2017-11-11 DIAGNOSIS — E7800 Pure hypercholesterolemia, unspecified: Secondary | ICD-10-CM | POA: Insufficient documentation

## 2017-11-11 DIAGNOSIS — Z79899 Other long term (current) drug therapy: Secondary | ICD-10-CM | POA: Insufficient documentation

## 2017-11-11 DIAGNOSIS — R000 Tachycardia, unspecified: Secondary | ICD-10-CM | POA: Diagnosis not present

## 2017-11-11 DIAGNOSIS — R0781 Pleurodynia: Secondary | ICD-10-CM | POA: Diagnosis not present

## 2017-11-11 DIAGNOSIS — S3991XA Unspecified injury of abdomen, initial encounter: Secondary | ICD-10-CM | POA: Diagnosis not present

## 2017-11-11 DIAGNOSIS — Z886 Allergy status to analgesic agent status: Secondary | ICD-10-CM | POA: Diagnosis not present

## 2017-11-11 DIAGNOSIS — Z882 Allergy status to sulfonamides status: Secondary | ICD-10-CM | POA: Diagnosis not present

## 2017-11-11 DIAGNOSIS — S32424A Nondisplaced fracture of posterior wall of right acetabulum, initial encounter for closed fracture: Secondary | ICD-10-CM

## 2017-11-11 DIAGNOSIS — S0990XA Unspecified injury of head, initial encounter: Secondary | ICD-10-CM | POA: Diagnosis not present

## 2017-11-11 DIAGNOSIS — S32471A Displaced fracture of medial wall of right acetabulum, initial encounter for closed fracture: Principal | ICD-10-CM

## 2017-11-11 DIAGNOSIS — Y92410 Unspecified street and highway as the place of occurrence of the external cause: Secondary | ICD-10-CM | POA: Insufficient documentation

## 2017-11-11 DIAGNOSIS — Z8711 Personal history of peptic ulcer disease: Secondary | ICD-10-CM | POA: Insufficient documentation

## 2017-11-11 DIAGNOSIS — E079 Disorder of thyroid, unspecified: Secondary | ICD-10-CM | POA: Insufficient documentation

## 2017-11-11 DIAGNOSIS — M25572 Pain in left ankle and joints of left foot: Secondary | ICD-10-CM | POA: Diagnosis not present

## 2017-11-11 DIAGNOSIS — S32409A Unspecified fracture of unspecified acetabulum, initial encounter for closed fracture: Secondary | ICD-10-CM

## 2017-11-11 DIAGNOSIS — S32501A Unspecified fracture of right pubis, initial encounter for closed fracture: Secondary | ICD-10-CM | POA: Diagnosis not present

## 2017-11-11 DIAGNOSIS — S32591A Other specified fracture of right pubis, initial encounter for closed fracture: Secondary | ICD-10-CM

## 2017-11-11 DIAGNOSIS — S199XXA Unspecified injury of neck, initial encounter: Secondary | ICD-10-CM | POA: Diagnosis not present

## 2017-11-11 DIAGNOSIS — M25579 Pain in unspecified ankle and joints of unspecified foot: Secondary | ICD-10-CM

## 2017-11-11 DIAGNOSIS — R079 Chest pain, unspecified: Secondary | ICD-10-CM | POA: Diagnosis not present

## 2017-11-11 DIAGNOSIS — R2681 Unsteadiness on feet: Secondary | ICD-10-CM | POA: Diagnosis not present

## 2017-11-11 DIAGNOSIS — M542 Cervicalgia: Secondary | ICD-10-CM | POA: Diagnosis not present

## 2017-11-11 DIAGNOSIS — S32401A Unspecified fracture of right acetabulum, initial encounter for closed fracture: Secondary | ICD-10-CM | POA: Diagnosis not present

## 2017-11-11 DIAGNOSIS — S32511A Fracture of superior rim of right pubis, initial encounter for closed fracture: Secondary | ICD-10-CM

## 2017-11-11 DIAGNOSIS — R51 Headache: Secondary | ICD-10-CM | POA: Diagnosis not present

## 2017-11-11 LAB — I-STAT CHEM 8, ED
BUN: 16 mg/dL (ref 8–23)
CALCIUM ION: 1.1 mmol/L — AB (ref 1.15–1.40)
CHLORIDE: 105 mmol/L (ref 98–111)
CREATININE: 0.6 mg/dL (ref 0.44–1.00)
Glucose, Bld: 134 mg/dL — ABNORMAL HIGH (ref 70–99)
HCT: 40 % (ref 36.0–46.0)
Hemoglobin: 13.6 g/dL (ref 12.0–15.0)
Potassium: 4.8 mmol/L (ref 3.5–5.1)
Sodium: 141 mmol/L (ref 135–145)
TCO2: 28 mmol/L (ref 22–32)

## 2017-11-11 LAB — COMPREHENSIVE METABOLIC PANEL
ALT: 43 U/L (ref 0–44)
AST: 75 U/L — AB (ref 15–41)
Albumin: 3.8 g/dL (ref 3.5–5.0)
Alkaline Phosphatase: 52 U/L (ref 38–126)
Anion gap: 12 (ref 5–15)
BUN: 12 mg/dL (ref 8–23)
CO2: 24 mmol/L (ref 22–32)
CREATININE: 0.65 mg/dL (ref 0.44–1.00)
Calcium: 9.4 mg/dL (ref 8.9–10.3)
Chloride: 106 mmol/L (ref 98–111)
GFR calc Af Amer: >60 mL/min (ref >60)
GFR calc non Af Amer: >60 mL/min (ref >60)
Glucose, Bld: 137 mg/dL — ABNORMAL HIGH (ref 70–99)
Potassium: 5 mmol/L (ref 3.5–5.1)
Sodium: 142 mmol/L (ref 135–145)
Total Bilirubin: 1.1 mg/dL (ref 0.3–1.2)
Total Protein: 6.3 g/dL — ABNORMAL LOW (ref 6.5–8.1)

## 2017-11-11 LAB — CBC WITH DIFFERENTIAL/PLATELET
Abs Immature Granulocytes: 0.1 10*3/uL (ref 0.0–0.1)
BASOS ABS: 0.1 10*3/uL (ref 0.0–0.1)
Basophils Relative: 0 %
Eosinophils Absolute: 0 10*3/uL (ref 0.0–0.7)
Eosinophils Relative: 0 %
HCT: 41 % (ref 36.0–46.0)
Hemoglobin: 12.6 g/dL (ref 12.0–15.0)
Immature Granulocytes: 0 %
Lymphocytes Relative: 6 %
Lymphs Abs: 0.8 10*3/uL (ref 0.7–4.0)
MCH: 29.9 pg (ref 26.0–34.0)
MCHC: 30.7 g/dL (ref 30.0–36.0)
MCV: 97.2 fL (ref 78.0–100.0)
MONO ABS: 0.7 10*3/uL (ref 0.1–1.0)
Monocytes Relative: 5 %
Neutro Abs: 11.5 10*3/uL — ABNORMAL HIGH (ref 1.7–7.7)
Neutrophils Relative %: 89 %
Platelets: 215 10*3/uL (ref 150–400)
RBC: 4.22 MIL/uL (ref 3.87–5.11)
RDW: 13.2 % (ref 11.5–15.5)
WBC: 13.1 10*3/uL — AB (ref 4.0–10.5)

## 2017-11-11 LAB — URINALYSIS, ROUTINE W REFLEX MICROSCOPIC
BACTERIA UA: NONE SEEN
Bilirubin Urine: NEGATIVE
Glucose, UA: NEGATIVE mg/dL
Hgb urine dipstick: NEGATIVE
Ketones, ur: 5 mg/dL — AB
Nitrite: NEGATIVE
Protein, ur: NEGATIVE mg/dL
Specific Gravity, Urine: 1.015 (ref 1.005–1.030)
pH: 6 (ref 5.0–8.0)

## 2017-11-11 LAB — LIPASE, BLOOD: Lipase: 38 U/L (ref 11–51)

## 2017-11-11 MED ORDER — FENTANYL CITRATE (PF) 100 MCG/2ML IJ SOLN
50.0000 ug | Freq: Once | INTRAMUSCULAR | Status: AC
  Administered 2017-11-11: 50 ug via INTRAVENOUS
  Filled 2017-11-11: qty 2

## 2017-11-11 MED ORDER — IOHEXOL 300 MG/ML  SOLN
100.0000 mL | Freq: Once | INTRAMUSCULAR | Status: AC | PRN
  Administered 2017-11-11: 100 mL via INTRAVENOUS

## 2017-11-11 MED ORDER — HYDROMORPHONE HCL 1 MG/ML IJ SOLN
0.5000 mg | Freq: Once | INTRAMUSCULAR | Status: AC
  Administered 2017-11-11: 0.5 mg via INTRAVENOUS
  Filled 2017-11-11: qty 1

## 2017-11-11 MED ORDER — ONDANSETRON HCL 4 MG/2ML IJ SOLN
4.0000 mg | Freq: Once | INTRAMUSCULAR | Status: AC
  Administered 2017-11-11: 4 mg via INTRAVENOUS
  Filled 2017-11-11: qty 2
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
## 2017-11-11 NOTE — ED Notes (Signed)
Patient transported to CT 

## 2017-11-12 ENCOUNTER — Observation Stay (HOSPITAL_COMMUNITY): Payer: Medicare Other

## 2017-11-12 DIAGNOSIS — S32414A Nondisplaced fracture of anterior wall of right acetabulum, initial encounter for closed fracture: Secondary | ICD-10-CM | POA: Diagnosis not present

## 2017-11-12 DIAGNOSIS — S32501A Unspecified fracture of right pubis, initial encounter for closed fracture: Secondary | ICD-10-CM | POA: Diagnosis not present

## 2017-11-12 DIAGNOSIS — S32401A Unspecified fracture of right acetabulum, initial encounter for closed fracture: Secondary | ICD-10-CM | POA: Diagnosis not present

## 2017-11-12 MED ORDER — ONDANSETRON 4 MG PO TBDP: 4.0000 mg | ORAL_TABLET | Freq: Four times a day (QID) | ORAL | Status: DC | PRN

## 2017-11-12 MED ORDER — CHOLECALCIFEROL 10 MCG (400 UNIT) PO TABS
400.0000 [IU] | ORAL_TABLET | Freq: Every morning | ORAL | Status: DC
  Administered 2017-11-12 – 2017-11-14 (×3): 400 [IU] via ORAL
  Filled 2017-11-12 (×4): qty 1

## 2017-11-12 MED ORDER — ADULT MULTIVITAMIN W/MINERALS CH
1.0000 | ORAL_TABLET | Freq: Every morning | ORAL | Status: DC
  Administered 2017-11-12 – 2017-11-14 (×3): 1 via ORAL
  Filled 2017-11-12 (×3): qty 1

## 2017-11-12 MED ORDER — DOCUSATE SODIUM 100 MG PO CAPS
100.0000 mg | ORAL_CAPSULE | Freq: Two times a day (BID) | ORAL | Status: DC
  Administered 2017-11-12 – 2017-11-14 (×5): 100 mg via ORAL
  Filled 2017-11-12 (×5): qty 1

## 2017-11-12 MED ORDER — ENOXAPARIN SODIUM 40 MG/0.4ML ~~LOC~~ SOLN
40.0000 mg | Freq: Every day | SUBCUTANEOUS | Status: DC
  Administered 2017-11-12 – 2017-11-14 (×3): 40 mg via SUBCUTANEOUS
  Filled 2017-11-12 (×3): qty 0.4

## 2017-11-12 MED ORDER — ZOLPIDEM TARTRATE 5 MG PO TABS
5.0000 mg | ORAL_TABLET | Freq: Every day | ORAL | Status: DC
  Administered 2017-11-12 – 2017-11-13 (×2): 5 mg via ORAL
  Filled 2017-11-12 (×2): qty 1

## 2017-11-12 MED ORDER — ACETAMINOPHEN 325 MG PO TABS
650.0000 mg | ORAL_TABLET | Freq: Four times a day (QID) | ORAL | Status: DC
  Administered 2017-11-12 – 2017-11-14 (×10): 650 mg via ORAL
  Filled 2017-11-12 (×10): qty 2

## 2017-11-12 MED ORDER — NAPHAZOLINE-GLYCERIN 0.012-0.2 % OP SOLN
1.0000 [drp] | Freq: Four times a day (QID) | OPHTHALMIC | Status: DC | PRN
  Filled 2017-11-12: qty 15

## 2017-11-12 MED ORDER — ONDANSETRON HCL 4 MG/2ML IJ SOLN: 4.0000 mg | Freq: Four times a day (QID) | INTRAMUSCULAR | Status: DC | PRN

## 2017-11-12 MED ORDER — OXYCODONE HCL 5 MG PO TABS
5.0000 mg | ORAL_TABLET | ORAL | Status: DC | PRN
  Administered 2017-11-12 – 2017-11-13 (×2): 5 mg via ORAL
  Filled 2017-11-12 (×2): qty 1

## 2017-11-12 MED ORDER — METHOCARBAMOL 500 MG PO TABS: 500.0000 mg | ORAL_TABLET | Freq: Four times a day (QID) | ORAL | Status: DC | PRN

## 2017-11-12 MED ORDER — ATORVASTATIN CALCIUM 10 MG PO TABS
10.0000 mg | ORAL_TABLET | Freq: Every day | ORAL | Status: DC
  Administered 2017-11-12 – 2017-11-14 (×3): 10 mg via ORAL
  Filled 2017-11-12 (×3): qty 1

## 2017-11-12 MED ORDER — TRAMADOL HCL 50 MG PO TABS
50.0000 mg | ORAL_TABLET | Freq: Four times a day (QID) | ORAL | Status: DC | PRN
  Administered 2017-11-12: 50 mg via ORAL
  Filled 2017-11-12: qty 1

## 2017-11-12 MED ORDER — TRAMADOL HCL 50 MG PO TABS
50.0000 mg | ORAL_TABLET | Freq: Four times a day (QID) | ORAL | Status: DC
  Administered 2017-11-12 – 2017-11-14 (×10): 50 mg via ORAL
  Filled 2017-11-12 (×10): qty 1

## 2017-11-12 MED ORDER — ALENDRONATE SODIUM 70 MG PO TABS: 70.0000 mg | ORAL_TABLET | ORAL | Status: DC
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
## 2017-11-13 ENCOUNTER — Observation Stay (HOSPITAL_COMMUNITY): Payer: Medicare Other

## 2017-11-13 DIAGNOSIS — S32501A Unspecified fracture of right pubis, initial encounter for closed fracture: Secondary | ICD-10-CM | POA: Diagnosis not present

## 2017-11-13 DIAGNOSIS — M25572 Pain in left ankle and joints of left foot: Secondary | ICD-10-CM | POA: Diagnosis not present

## 2017-11-13 DIAGNOSIS — S32401A Unspecified fracture of right acetabulum, initial encounter for closed fracture: Secondary | ICD-10-CM | POA: Diagnosis not present

## 2017-11-13 LAB — CBC
HCT: 35.8 % — ABNORMAL LOW (ref 36.0–46.0)
Hemoglobin: 11.3 g/dL — ABNORMAL LOW (ref 12.0–15.0)
MCH: 30.3 pg (ref 26.0–34.0)
MCHC: 31.6 g/dL (ref 30.0–36.0)
MCV: 96 fL (ref 78.0–100.0)
PLATELETS: 199 10*3/uL (ref 150–400)
RBC: 3.73 MIL/uL — ABNORMAL LOW (ref 3.87–5.11)
RDW: 13.2 % (ref 11.5–15.5)
WBC: 9 10*3/uL (ref 4.0–10.5)

## 2017-11-13 MED ORDER — SODIUM CHLORIDE 0.9 % IV BOLUS
1000.0000 mL | Freq: Once | INTRAVENOUS | Status: AC
  Administered 2017-11-13: 1000 mL via INTRAVENOUS
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
## 2017-11-13 NOTE — Clinical Social Work Note (Signed)
Clinical Social Worker met with patient and patient niece at bedside to provide bed offers.  Patient and family have accepted a bed at [facility] with hopeful discharge 7/26.  CSW has initiated insurance authorization with [facility] and will follow up with facility once authorization provided.  CSW remains available for support and to facilitate patient discharge needs.  Barbette Or, [place]
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
## 2017-11-14 DIAGNOSIS — S32401D Unspecified fracture of right acetabulum, subsequent encounter for fracture with routine healing: Secondary | ICD-10-CM | POA: Diagnosis not present

## 2017-11-14 DIAGNOSIS — S2222XA Fracture of body of sternum, initial encounter for closed fracture: Secondary | ICD-10-CM | POA: Diagnosis not present

## 2017-11-14 DIAGNOSIS — E079 Disorder of thyroid, unspecified: Secondary | ICD-10-CM | POA: Diagnosis not present

## 2017-11-14 DIAGNOSIS — Z882 Allergy status to sulfonamides status: Secondary | ICD-10-CM | POA: Diagnosis not present

## 2017-11-14 DIAGNOSIS — D62 Acute posthemorrhagic anemia: Secondary | ICD-10-CM | POA: Diagnosis not present

## 2017-11-14 DIAGNOSIS — S32511D Fracture of superior rim of right pubis, subsequent encounter for fracture with routine healing: Secondary | ICD-10-CM | POA: Diagnosis not present

## 2017-11-14 DIAGNOSIS — R2681 Unsteadiness on feet: Secondary | ICD-10-CM | POA: Diagnosis not present

## 2017-11-14 DIAGNOSIS — E7800 Pure hypercholesterolemia, unspecified: Secondary | ICD-10-CM | POA: Diagnosis not present

## 2017-11-14 DIAGNOSIS — S32501A Unspecified fracture of right pubis, initial encounter for closed fracture: Secondary | ICD-10-CM | POA: Diagnosis not present

## 2017-11-14 DIAGNOSIS — Z743 Need for continuous supervision: Secondary | ICD-10-CM | POA: Diagnosis not present

## 2017-11-14 DIAGNOSIS — S32401A Unspecified fracture of right acetabulum, initial encounter for closed fracture: Secondary | ICD-10-CM | POA: Diagnosis not present

## 2017-11-14 DIAGNOSIS — M25572 Pain in left ankle and joints of left foot: Secondary | ICD-10-CM | POA: Diagnosis not present

## 2017-11-14 DIAGNOSIS — S32471A Displaced fracture of medial wall of right acetabulum, initial encounter for closed fracture: Secondary | ICD-10-CM | POA: Diagnosis not present

## 2017-11-14 DIAGNOSIS — E785 Hyperlipidemia, unspecified: Secondary | ICD-10-CM | POA: Diagnosis not present

## 2017-11-14 DIAGNOSIS — R279 Unspecified lack of coordination: Secondary | ICD-10-CM | POA: Diagnosis not present

## 2017-11-14 DIAGNOSIS — Z8711 Personal history of peptic ulcer disease: Secondary | ICD-10-CM | POA: Diagnosis not present

## 2017-11-14 DIAGNOSIS — S32424A Nondisplaced fracture of posterior wall of right acetabulum, initial encounter for closed fracture: Secondary | ICD-10-CM | POA: Diagnosis not present

## 2017-11-14 DIAGNOSIS — D509 Iron deficiency anemia, unspecified: Secondary | ICD-10-CM | POA: Diagnosis not present

## 2017-11-14 DIAGNOSIS — S32591D Other specified fracture of right pubis, subsequent encounter for fracture with routine healing: Secondary | ICD-10-CM | POA: Diagnosis not present

## 2017-11-14 DIAGNOSIS — E875 Hyperkalemia: Secondary | ICD-10-CM | POA: Diagnosis not present

## 2017-11-14 DIAGNOSIS — S2222XD Fracture of body of sternum, subsequent encounter for fracture with routine healing: Secondary | ICD-10-CM | POA: Diagnosis not present

## 2017-11-14 DIAGNOSIS — Z886 Allergy status to analgesic agent status: Secondary | ICD-10-CM | POA: Diagnosis not present

## 2017-11-14 DIAGNOSIS — R112 Nausea with vomiting, unspecified: Secondary | ICD-10-CM | POA: Diagnosis not present

## 2017-11-14 DIAGNOSIS — Z79899 Other long term (current) drug therapy: Secondary | ICD-10-CM | POA: Diagnosis not present

## 2017-11-14 DIAGNOSIS — R197 Diarrhea, unspecified: Secondary | ICD-10-CM | POA: Diagnosis not present

## 2017-11-14 DIAGNOSIS — A084 Viral intestinal infection, unspecified: Secondary | ICD-10-CM | POA: Diagnosis not present

## 2017-11-14 MED ORDER — ACETAMINOPHEN 325 MG PO TABS: 650.0000 mg | ORAL_TABLET | Freq: Four times a day (QID) | ORAL | Status: AC

## 2017-11-14 MED ORDER — METHOCARBAMOL 500 MG PO TABS: 500.0000 mg | ORAL_TABLET | Freq: Three times a day (TID) | ORAL | 0 refills | Status: AC | PRN

## 2017-11-14 MED ORDER — ONDANSETRON 4 MG PO TBDP: 4.0000 mg | ORAL_TABLET | Freq: Four times a day (QID) | ORAL | 0 refills | Status: AC | PRN

## 2017-11-14 MED ORDER — TRAMADOL HCL 50 MG PO TABS: 50.0000 mg | ORAL_TABLET | Freq: Four times a day (QID) | ORAL | 0 refills | Status: DC

## 2017-11-14 MED ORDER — DOCUSATE SODIUM 100 MG PO CAPS: 100.0000 mg | ORAL_CAPSULE | Freq: Two times a day (BID) | ORAL | 0 refills | Status: AC

## 2017-11-14 MED ORDER — ENOXAPARIN SODIUM 40 MG/0.4ML ~~LOC~~ SOLN: 40.0000 mg | Freq: Every day | SUBCUTANEOUS | Status: DC
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
## 2017-11-14 NOTE — Social Work (Signed)
Await [facility] [employer] [facility]  Doy HutchingIsabel H Maddy Graham, LCSWA [facility] Clinical Social Work [phone]

## 2017-11-14 NOTE — Progress Notes (Addendum)
[employer] Medicare auth received # [id number]  [facility] Anticipated discharge date: 7/26 Family notified: at bedside Transportation by [facility]AR- called at 4:10pm Report #: [phone]   CSW signing off.  Burna SisJenna H. Jonny Dearden, LCSW Clinical Social Worker [phone]
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
## 2017-11-14 NOTE — Progress Notes (Signed)
[facility] with no answer. RN to call back in 15 mins

## 2017-11-14 NOTE — Plan of Care (Signed)

## 2017-11-14 NOTE — Clinical Social Work Placement (Signed)
   CLINICAL SOCIAL WORK PLACEMENT  NOTE  Date:  11/14/2017  Patient Details  Name: Ashley Chambers MRN: [id number] Date of Birth: 11-Sep-1930  Clinical Social Work is seeking post-discharge placement for this patient at the Skilled  Nursing Facility level of care (*CSW will initial, date and re-position this form in  chart as items are completed):  Yes   Patient/family provided with [facility] Clinical Social Work Department's list of facilities offering this level of care within the geographic area requested by the patient (or if unable, by the patient's family).  Yes   Patient/family informed of their freedom to choose among providers that offer the needed level of care, that participate in Medicare, Medicaid or managed care program needed by the patient, have an available bed and are willing to accept the patient.  Yes   Patient/family informed of [facility]'s ownership interest in [facility] and [facility], as well as of the fact that they are under no obligation to receive care at these facilities.  PASRR submitted to EDS on 11/12/17     PASRR number received on 11/12/17     Existing PASRR number confirmed on       FL2 transmitted to all facilities in geographic area requested by pt/family on 11/12/17     FL2 transmitted to all facilities within larger geographic area on       Patient informed that his/her managed care company has contracts with or will negotiate with certain facilities, including the following:        Yes   Patient/family informed of bed offers received.  [facility]     Physician recommends and patient chooses bed at      [facility].  Patient to be transferred to facility by ptar     Patient family notified on 11/14/17 of transfer.  Name of family member notified:        PHYSICIAN Please sign FL2     Additional Comment:     _______________________________________________ Burna SisUris, Eyob Godlewski H, LCSW 11/14/2017, 4:15 PM

## 2017-11-14 NOTE — Progress Notes (Signed)
[facility]. No concerns voiced.
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
## 2017-11-14 NOTE — Progress Notes (Signed)
Discharge teaching complete, IV removed, day shift RN called report to [facility]. Pt left with their belongings with [facility].

## 2017-11-17 DIAGNOSIS — D509 Iron deficiency anemia, unspecified: Secondary | ICD-10-CM | POA: Diagnosis not present

## 2017-11-17 DIAGNOSIS — S32511D Fracture of superior rim of right pubis, subsequent encounter for fracture with routine healing: Secondary | ICD-10-CM | POA: Diagnosis not present

## 2017-11-17 DIAGNOSIS — S2222XD Fracture of body of sternum, subsequent encounter for fracture with routine healing: Secondary | ICD-10-CM | POA: Diagnosis not present

## 2017-11-17 DIAGNOSIS — E785 Hyperlipidemia, unspecified: Secondary | ICD-10-CM | POA: Diagnosis not present

## 2017-11-26 ENCOUNTER — Encounter: Payer: Self-pay | Admitting: Student

## 2017-11-26 DIAGNOSIS — S32401A Unspecified fracture of right acetabulum, initial encounter for closed fracture: Secondary | ICD-10-CM | POA: Insufficient documentation

## 2017-12-02 DIAGNOSIS — S32401D Unspecified fracture of right acetabulum, subsequent encounter for fracture with routine healing: Secondary | ICD-10-CM | POA: Diagnosis not present

## 2017-12-03 DIAGNOSIS — S32591D Other specified fracture of right pubis, subsequent encounter for fracture with routine healing: Secondary | ICD-10-CM | POA: Diagnosis not present

## 2017-12-03 DIAGNOSIS — S2222XD Fracture of body of sternum, subsequent encounter for fracture with routine healing: Secondary | ICD-10-CM | POA: Diagnosis not present

## 2017-12-05 DIAGNOSIS — E875 Hyperkalemia: Secondary | ICD-10-CM | POA: Diagnosis not present

## 2017-12-05 DIAGNOSIS — S32511D Fracture of superior rim of right pubis, subsequent encounter for fracture with routine healing: Secondary | ICD-10-CM | POA: Diagnosis not present

## 2017-12-05 DIAGNOSIS — S2220XD Unspecified fracture of sternum, subsequent encounter for fracture with routine healing: Secondary | ICD-10-CM | POA: Diagnosis not present

## 2017-12-05 DIAGNOSIS — S32499D Other specified fracture of unspecified acetabulum, subsequent encounter for fracture with routine healing: Secondary | ICD-10-CM | POA: Diagnosis not present

## 2017-12-05 DIAGNOSIS — E7800 Pure hypercholesterolemia, unspecified: Secondary | ICD-10-CM | POA: Diagnosis not present

## 2017-12-23 DIAGNOSIS — G4700 Insomnia, unspecified: Secondary | ICD-10-CM | POA: Diagnosis not present

## 2017-12-23 DIAGNOSIS — E782 Mixed hyperlipidemia: Secondary | ICD-10-CM | POA: Diagnosis not present

## 2017-12-23 DIAGNOSIS — S32401D Unspecified fracture of right acetabulum, subsequent encounter for fracture with routine healing: Secondary | ICD-10-CM | POA: Diagnosis not present

## 2017-12-23 DIAGNOSIS — Z8781 Personal history of (healed) traumatic fracture: Secondary | ICD-10-CM | POA: Diagnosis not present

## 2017-12-23 DIAGNOSIS — M810 Age-related osteoporosis without current pathological fracture: Secondary | ICD-10-CM | POA: Diagnosis not present

## 2018-01-16 DIAGNOSIS — Z23 Encounter for immunization: Secondary | ICD-10-CM | POA: Diagnosis not present

## 2018-02-03 DIAGNOSIS — S32401D Unspecified fracture of right acetabulum, subsequent encounter for fracture with routine healing: Secondary | ICD-10-CM | POA: Diagnosis not present

## 2018-03-30 DIAGNOSIS — M810 Age-related osteoporosis without current pathological fracture: Secondary | ICD-10-CM | POA: Diagnosis not present

## 2018-03-30 DIAGNOSIS — G4700 Insomnia, unspecified: Secondary | ICD-10-CM | POA: Diagnosis not present

## 2018-03-30 DIAGNOSIS — E782 Mixed hyperlipidemia: Secondary | ICD-10-CM | POA: Diagnosis not present

## 2018-03-30 DIAGNOSIS — Z0000 Encounter for general adult medical examination without abnormal findings: Secondary | ICD-10-CM | POA: Diagnosis not present

## 2018-04-21 DIAGNOSIS — K5900 Constipation, unspecified: Secondary | ICD-10-CM | POA: Diagnosis not present

## 2018-05-06 DIAGNOSIS — S32401D Unspecified fracture of right acetabulum, subsequent encounter for fracture with routine healing: Secondary | ICD-10-CM | POA: Diagnosis not present

## 2018-05-19 DIAGNOSIS — M810 Age-related osteoporosis without current pathological fracture: Secondary | ICD-10-CM | POA: Diagnosis not present

## 2018-05-29 DIAGNOSIS — M810 Age-related osteoporosis without current pathological fracture: Secondary | ICD-10-CM | POA: Diagnosis not present

## 2018-06-09 DIAGNOSIS — M810 Age-related osteoporosis without current pathological fracture: Secondary | ICD-10-CM | POA: Diagnosis not present

## 2018-10-01 DIAGNOSIS — M810 Age-related osteoporosis without current pathological fracture: Secondary | ICD-10-CM | POA: Diagnosis not present

## 2018-10-01 DIAGNOSIS — E782 Mixed hyperlipidemia: Secondary | ICD-10-CM | POA: Diagnosis not present

## 2018-10-01 DIAGNOSIS — R030 Elevated blood-pressure reading, without diagnosis of hypertension: Secondary | ICD-10-CM | POA: Diagnosis not present

## 2018-10-01 DIAGNOSIS — G4700 Insomnia, unspecified: Secondary | ICD-10-CM | POA: Diagnosis not present

## 2018-11-09 ENCOUNTER — Other Ambulatory Visit (HOSPITAL_BASED_OUTPATIENT_CLINIC_OR_DEPARTMENT_OTHER): Payer: Self-pay | Admitting: Family Medicine

## 2018-11-09 DIAGNOSIS — Z1231 Encounter for screening mammogram for malignant neoplasm of breast: Secondary | ICD-10-CM

## 2018-11-12 ENCOUNTER — Other Ambulatory Visit: Payer: Self-pay

## 2018-11-12 ENCOUNTER — Ambulatory Visit (HOSPITAL_BASED_OUTPATIENT_CLINIC_OR_DEPARTMENT_OTHER)
Admission: RE | Admit: 2018-11-12 | Discharge: 2018-11-12 | Disposition: A | Discharge status: Home / Self Care | Payer: Medicare Other | Source: Ambulatory Visit | Attending: Family Medicine | Admitting: Family Medicine

## 2018-11-12 DIAGNOSIS — Z1231 Encounter for screening mammogram for malignant neoplasm of breast: Secondary | ICD-10-CM | POA: Diagnosis not present

## 2018-12-09 DIAGNOSIS — M810 Age-related osteoporosis without current pathological fracture: Secondary | ICD-10-CM | POA: Diagnosis not present

## 2019-05-18 DIAGNOSIS — Z20828 Contact with and (suspected) exposure to other viral communicable diseases: Secondary | ICD-10-CM | POA: Diagnosis not present

## 2019-07-21 DIAGNOSIS — M810 Age-related osteoporosis without current pathological fracture: Secondary | ICD-10-CM | POA: Diagnosis not present
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
## 2019-10-07 DIAGNOSIS — K5900 Constipation, unspecified: Secondary | ICD-10-CM | POA: Diagnosis not present

## 2019-10-07 DIAGNOSIS — G4700 Insomnia, unspecified: Secondary | ICD-10-CM | POA: Diagnosis not present

## 2019-10-07 DIAGNOSIS — E782 Mixed hyperlipidemia: Secondary | ICD-10-CM | POA: Diagnosis not present

## 2019-10-07 DIAGNOSIS — K219 Gastro-esophageal reflux disease without esophagitis: Secondary | ICD-10-CM | POA: Diagnosis not present

## 2019-10-07 DIAGNOSIS — M810 Age-related osteoporosis without current pathological fracture: Secondary | ICD-10-CM | POA: Diagnosis not present

## 2019-10-07 DIAGNOSIS — R011 Cardiac murmur, unspecified: Secondary | ICD-10-CM | POA: Diagnosis not present

## 2019-10-07 DIAGNOSIS — M4800 Spinal stenosis, site unspecified: Secondary | ICD-10-CM | POA: Diagnosis not present

## 2019-10-07 DIAGNOSIS — Z8781 Personal history of (healed) traumatic fracture: Secondary | ICD-10-CM | POA: Diagnosis not present

## 2019-10-07 DIAGNOSIS — I700 Atherosclerosis of aorta: Secondary | ICD-10-CM | POA: Diagnosis not present

## 2019-10-12 DIAGNOSIS — S60229A Contusion of unspecified hand, initial encounter: Secondary | ICD-10-CM | POA: Diagnosis not present

## 2019-10-12 DIAGNOSIS — M25532 Pain in left wrist: Secondary | ICD-10-CM | POA: Diagnosis not present

## 2020-01-04 ENCOUNTER — Other Ambulatory Visit (HOSPITAL_BASED_OUTPATIENT_CLINIC_OR_DEPARTMENT_OTHER): Payer: Self-pay | Admitting: Family Medicine

## 2020-01-04 DIAGNOSIS — Z1231 Encounter for screening mammogram for malignant neoplasm of breast: Secondary | ICD-10-CM

## 2020-01-05 ENCOUNTER — Ambulatory Visit (HOSPITAL_BASED_OUTPATIENT_CLINIC_OR_DEPARTMENT_OTHER): Payer: Medicare Other

## 2020-01-07 ENCOUNTER — Ambulatory Visit (HOSPITAL_BASED_OUTPATIENT_CLINIC_OR_DEPARTMENT_OTHER): Payer: Medicare Other

## 2020-01-20 ENCOUNTER — Ambulatory Visit (HOSPITAL_BASED_OUTPATIENT_CLINIC_OR_DEPARTMENT_OTHER): Payer: Medicare HMO

## 2020-01-21 ENCOUNTER — Ambulatory Visit (HOSPITAL_BASED_OUTPATIENT_CLINIC_OR_DEPARTMENT_OTHER)
Admission: RE | Admit: 2020-01-21 | Discharge: 2020-01-21 | Disposition: A | Discharge status: Home / Self Care | Payer: Medicare HMO | Source: Ambulatory Visit | Attending: Family Medicine | Admitting: Family Medicine

## 2020-01-21 ENCOUNTER — Other Ambulatory Visit: Payer: Self-pay

## 2020-01-21 DIAGNOSIS — Z1231 Encounter for screening mammogram for malignant neoplasm of breast: Secondary | ICD-10-CM | POA: Insufficient documentation

## 2020-01-26 DIAGNOSIS — Z23 Encounter for immunization: Secondary | ICD-10-CM | POA: Diagnosis not present

## 2020-02-11 DIAGNOSIS — Z0100 Encounter for examination of eyes and vision without abnormal findings: Secondary | ICD-10-CM | POA: Diagnosis not present

## 2020-04-07 DIAGNOSIS — E46 Unspecified protein-calorie malnutrition: Secondary | ICD-10-CM | POA: Diagnosis not present

## 2020-04-07 DIAGNOSIS — M810 Age-related osteoporosis without current pathological fracture: Secondary | ICD-10-CM | POA: Diagnosis not present

## 2020-04-07 DIAGNOSIS — K5900 Constipation, unspecified: Secondary | ICD-10-CM | POA: Diagnosis not present

## 2020-04-07 DIAGNOSIS — G4700 Insomnia, unspecified: Secondary | ICD-10-CM | POA: Diagnosis not present

## 2020-04-07 DIAGNOSIS — I700 Atherosclerosis of aorta: Secondary | ICD-10-CM | POA: Diagnosis not present

## 2020-04-07 DIAGNOSIS — Z1389 Encounter for screening for other disorder: Secondary | ICD-10-CM | POA: Diagnosis not present

## 2020-04-07 DIAGNOSIS — E782 Mixed hyperlipidemia: Secondary | ICD-10-CM | POA: Diagnosis not present

## 2020-04-07 DIAGNOSIS — R030 Elevated blood-pressure reading, without diagnosis of hypertension: Secondary | ICD-10-CM | POA: Diagnosis not present

## 2020-04-07 DIAGNOSIS — M4800 Spinal stenosis, site unspecified: Secondary | ICD-10-CM | POA: Diagnosis not present

## 2020-04-07 DIAGNOSIS — Z0000 Encounter for general adult medical examination without abnormal findings: Secondary | ICD-10-CM | POA: Diagnosis not present

## 2020-04-07 DIAGNOSIS — F439 Reaction to severe stress, unspecified: Secondary | ICD-10-CM | POA: Diagnosis not present

## 2020-04-07 DIAGNOSIS — K219 Gastro-esophageal reflux disease without esophagitis: Secondary | ICD-10-CM | POA: Diagnosis not present

## 2020-06-13 DIAGNOSIS — H6123 Impacted cerumen, bilateral: Secondary | ICD-10-CM | POA: Diagnosis not present

## 2020-06-13 DIAGNOSIS — H9193 Unspecified hearing loss, bilateral: Secondary | ICD-10-CM | POA: Diagnosis not present

## 2020-07-31 DIAGNOSIS — M810 Age-related osteoporosis without current pathological fracture: Secondary | ICD-10-CM | POA: Diagnosis not present

## 2020-10-02 DIAGNOSIS — K5900 Constipation, unspecified: Secondary | ICD-10-CM | POA: Diagnosis not present

## 2020-10-02 DIAGNOSIS — G4700 Insomnia, unspecified: Secondary | ICD-10-CM | POA: Diagnosis not present

## 2020-10-02 DIAGNOSIS — Z8781 Personal history of (healed) traumatic fracture: Secondary | ICD-10-CM | POA: Diagnosis not present

## 2020-10-02 DIAGNOSIS — M4800 Spinal stenosis, site unspecified: Secondary | ICD-10-CM | POA: Diagnosis not present

## 2020-10-02 DIAGNOSIS — E782 Mixed hyperlipidemia: Secondary | ICD-10-CM | POA: Diagnosis not present

## 2020-10-02 DIAGNOSIS — E46 Unspecified protein-calorie malnutrition: Secondary | ICD-10-CM | POA: Diagnosis not present

## 2020-10-02 DIAGNOSIS — I700 Atherosclerosis of aorta: Secondary | ICD-10-CM | POA: Diagnosis not present

## 2020-10-02 DIAGNOSIS — K219 Gastro-esophageal reflux disease without esophagitis: Secondary | ICD-10-CM | POA: Diagnosis not present

## 2020-10-02 DIAGNOSIS — M810 Age-related osteoporosis without current pathological fracture: Secondary | ICD-10-CM | POA: Diagnosis not present

## 2020-10-03 ENCOUNTER — Other Ambulatory Visit (HOSPITAL_BASED_OUTPATIENT_CLINIC_OR_DEPARTMENT_OTHER): Payer: Self-pay | Admitting: Family Medicine

## 2020-10-03 DIAGNOSIS — Z13820 Encounter for screening for osteoporosis: Secondary | ICD-10-CM

## 2020-10-03 DIAGNOSIS — Z1231 Encounter for screening mammogram for malignant neoplasm of breast: Secondary | ICD-10-CM

## 2020-10-17 ENCOUNTER — Other Ambulatory Visit (HOSPITAL_BASED_OUTPATIENT_CLINIC_OR_DEPARTMENT_OTHER): Payer: Medicare HMO

## 2020-10-31 ENCOUNTER — Other Ambulatory Visit (HOSPITAL_BASED_OUTPATIENT_CLINIC_OR_DEPARTMENT_OTHER): Payer: Medicare HMO

## 2020-10-31 ENCOUNTER — Inpatient Hospital Stay (HOSPITAL_BASED_OUTPATIENT_CLINIC_OR_DEPARTMENT_OTHER): Admission: RE | Admit: 2020-10-31 | Payer: Medicare HMO | Source: Ambulatory Visit

## 2021-01-22 ENCOUNTER — Other Ambulatory Visit (HOSPITAL_BASED_OUTPATIENT_CLINIC_OR_DEPARTMENT_OTHER): Payer: Medicare HMO

## 2021-01-22 ENCOUNTER — Ambulatory Visit (HOSPITAL_BASED_OUTPATIENT_CLINIC_OR_DEPARTMENT_OTHER): Payer: Medicare HMO

## 2021-01-30 DIAGNOSIS — M810 Age-related osteoporosis without current pathological fracture: Secondary | ICD-10-CM | POA: Diagnosis not present

## 2021-01-30 DIAGNOSIS — Z23 Encounter for immunization: Secondary | ICD-10-CM | POA: Diagnosis not present

## 2021-03-19 ENCOUNTER — Other Ambulatory Visit: Payer: Self-pay

## 2021-03-19 ENCOUNTER — Ambulatory Visit (HOSPITAL_BASED_OUTPATIENT_CLINIC_OR_DEPARTMENT_OTHER)
Admission: RE | Admit: 2021-03-19 | Discharge: 2021-03-19 | Disposition: A | Discharge status: Home / Self Care | Payer: Medicare HMO | Source: Ambulatory Visit | Attending: Family Medicine | Admitting: Family Medicine

## 2021-03-19 DIAGNOSIS — Z1231 Encounter for screening mammogram for malignant neoplasm of breast: Secondary | ICD-10-CM | POA: Insufficient documentation

## 2021-03-19 DIAGNOSIS — M810 Age-related osteoporosis without current pathological fracture: Secondary | ICD-10-CM | POA: Insufficient documentation

## 2021-03-19 DIAGNOSIS — Z780 Asymptomatic menopausal state: Secondary | ICD-10-CM | POA: Diagnosis not present

## 2021-03-19 DIAGNOSIS — Z13820 Encounter for screening for osteoporosis: Secondary | ICD-10-CM | POA: Insufficient documentation

## 2021-05-17 DIAGNOSIS — M810 Age-related osteoporosis without current pathological fracture: Secondary | ICD-10-CM | POA: Diagnosis not present

## 2021-05-17 DIAGNOSIS — G4700 Insomnia, unspecified: Secondary | ICD-10-CM | POA: Diagnosis not present

## 2021-05-17 DIAGNOSIS — Z0000 Encounter for general adult medical examination without abnormal findings: Secondary | ICD-10-CM | POA: Diagnosis not present

## 2021-05-17 DIAGNOSIS — I700 Atherosclerosis of aorta: Secondary | ICD-10-CM | POA: Diagnosis not present

## 2021-05-17 DIAGNOSIS — K5900 Constipation, unspecified: Secondary | ICD-10-CM | POA: Diagnosis not present

## 2021-05-17 DIAGNOSIS — Z1389 Encounter for screening for other disorder: Secondary | ICD-10-CM | POA: Diagnosis not present

## 2021-05-17 DIAGNOSIS — K219 Gastro-esophageal reflux disease without esophagitis: Secondary | ICD-10-CM | POA: Diagnosis not present

## 2021-05-17 DIAGNOSIS — E782 Mixed hyperlipidemia: Secondary | ICD-10-CM | POA: Diagnosis not present

## 2021-05-17 DIAGNOSIS — M4800 Spinal stenosis, site unspecified: Secondary | ICD-10-CM | POA: Diagnosis not present

## 2021-08-04 DIAGNOSIS — Z20822 Contact with and (suspected) exposure to covid-19: Secondary | ICD-10-CM | POA: Diagnosis not present

## 2021-08-14 DIAGNOSIS — M810 Age-related osteoporosis without current pathological fracture: Secondary | ICD-10-CM | POA: Diagnosis not present

## 2021-12-10 DIAGNOSIS — K5900 Constipation, unspecified: Secondary | ICD-10-CM | POA: Diagnosis not present

## 2021-12-10 DIAGNOSIS — G4700 Insomnia, unspecified: Secondary | ICD-10-CM | POA: Diagnosis not present

## 2021-12-10 DIAGNOSIS — Z8781 Personal history of (healed) traumatic fracture: Secondary | ICD-10-CM | POA: Diagnosis not present

## 2021-12-10 DIAGNOSIS — K219 Gastro-esophageal reflux disease without esophagitis: Secondary | ICD-10-CM | POA: Diagnosis not present

## 2021-12-10 DIAGNOSIS — E46 Unspecified protein-calorie malnutrition: Secondary | ICD-10-CM | POA: Diagnosis not present

## 2021-12-10 DIAGNOSIS — I700 Atherosclerosis of aorta: Secondary | ICD-10-CM | POA: Diagnosis not present

## 2021-12-10 DIAGNOSIS — M4800 Spinal stenosis, site unspecified: Secondary | ICD-10-CM | POA: Diagnosis not present

## 2021-12-10 DIAGNOSIS — M810 Age-related osteoporosis without current pathological fracture: Secondary | ICD-10-CM | POA: Diagnosis not present

## 2021-12-10 DIAGNOSIS — E782 Mixed hyperlipidemia: Secondary | ICD-10-CM | POA: Diagnosis not present

## 2022-02-14 DIAGNOSIS — M810 Age-related osteoporosis without current pathological fracture: Secondary | ICD-10-CM | POA: Diagnosis not present

## 2022-03-24 ENCOUNTER — Other Ambulatory Visit: Payer: Self-pay

## 2022-03-24 ENCOUNTER — Inpatient Hospital Stay (HOSPITAL_COMMUNITY): Payer: Medicare HMO

## 2022-03-24 ENCOUNTER — Emergency Department (HOSPITAL_COMMUNITY): Payer: Medicare HMO

## 2022-03-24 ENCOUNTER — Encounter (HOSPITAL_COMMUNITY): Payer: Self-pay | Admitting: Internal Medicine

## 2022-03-24 ENCOUNTER — Inpatient Hospital Stay (HOSPITAL_COMMUNITY)
Admission: EM | Admit: 2022-03-24 | Discharge: 2022-03-28 | DRG: 562 | Disposition: A | Discharge status: Skilled Nursing Facility | Payer: Medicare HMO | Attending: Internal Medicine | Admitting: Internal Medicine

## 2022-03-24 DIAGNOSIS — Z806 Family history of leukemia: Secondary | ICD-10-CM | POA: Diagnosis not present

## 2022-03-24 DIAGNOSIS — K5641 Fecal impaction: Secondary | ICD-10-CM | POA: Diagnosis present

## 2022-03-24 DIAGNOSIS — Z8711 Personal history of peptic ulcer disease: Secondary | ICD-10-CM

## 2022-03-24 DIAGNOSIS — G4709 Other insomnia: Secondary | ICD-10-CM

## 2022-03-24 DIAGNOSIS — Z8249 Family history of ischemic heart disease and other diseases of the circulatory system: Secondary | ICD-10-CM | POA: Diagnosis not present

## 2022-03-24 DIAGNOSIS — S4992XA Unspecified injury of left shoulder and upper arm, initial encounter: Secondary | ICD-10-CM | POA: Diagnosis not present

## 2022-03-24 DIAGNOSIS — Z7401 Bed confinement status: Secondary | ICD-10-CM | POA: Diagnosis not present

## 2022-03-24 DIAGNOSIS — T426X5A Adverse effect of other antiepileptic and sedative-hypnotic drugs, initial encounter: Secondary | ICD-10-CM | POA: Diagnosis present

## 2022-03-24 DIAGNOSIS — M898X9 Other specified disorders of bone, unspecified site: Secondary | ICD-10-CM | POA: Diagnosis present

## 2022-03-24 DIAGNOSIS — R531 Weakness: Secondary | ICD-10-CM | POA: Diagnosis not present

## 2022-03-24 DIAGNOSIS — R079 Chest pain, unspecified: Secondary | ICD-10-CM | POA: Diagnosis not present

## 2022-03-24 DIAGNOSIS — S42402A Unspecified fracture of lower end of left humerus, initial encounter for closed fracture: Secondary | ICD-10-CM | POA: Diagnosis present

## 2022-03-24 DIAGNOSIS — R4182 Altered mental status, unspecified: Secondary | ICD-10-CM | POA: Diagnosis not present

## 2022-03-24 DIAGNOSIS — M542 Cervicalgia: Secondary | ICD-10-CM | POA: Diagnosis not present

## 2022-03-24 DIAGNOSIS — K5900 Constipation, unspecified: Secondary | ICD-10-CM | POA: Diagnosis present

## 2022-03-24 DIAGNOSIS — G4700 Insomnia, unspecified: Secondary | ICD-10-CM | POA: Diagnosis not present

## 2022-03-24 DIAGNOSIS — S42415A Nondisplaced simple supracondylar fracture without intercondylar fracture of left humerus, initial encounter for closed fracture: Secondary | ICD-10-CM | POA: Diagnosis not present

## 2022-03-24 DIAGNOSIS — Z4789 Encounter for other orthopedic aftercare: Secondary | ICD-10-CM | POA: Diagnosis not present

## 2022-03-24 DIAGNOSIS — Z8719 Personal history of other diseases of the digestive system: Secondary | ICD-10-CM

## 2022-03-24 DIAGNOSIS — Z79899 Other long term (current) drug therapy: Secondary | ICD-10-CM | POA: Diagnosis not present

## 2022-03-24 DIAGNOSIS — Z681 Body mass index (BMI) 19 or less, adult: Secondary | ICD-10-CM

## 2022-03-24 DIAGNOSIS — W19XXXA Unspecified fall, initial encounter: Secondary | ICD-10-CM | POA: Diagnosis not present

## 2022-03-24 DIAGNOSIS — Y92009 Unspecified place in unspecified non-institutional (private) residence as the place of occurrence of the external cause: Secondary | ICD-10-CM | POA: Diagnosis not present

## 2022-03-24 DIAGNOSIS — W1830XA Fall on same level, unspecified, initial encounter: Secondary | ICD-10-CM | POA: Diagnosis present

## 2022-03-24 DIAGNOSIS — E7800 Pure hypercholesterolemia, unspecified: Secondary | ICD-10-CM | POA: Diagnosis present

## 2022-03-24 DIAGNOSIS — M7981 Nontraumatic hematoma of soft tissue: Secondary | ICD-10-CM | POA: Diagnosis not present

## 2022-03-24 DIAGNOSIS — R937 Abnormal findings on diagnostic imaging of other parts of musculoskeletal system: Secondary | ICD-10-CM | POA: Diagnosis not present

## 2022-03-24 DIAGNOSIS — S0012XA Contusion of left eyelid and periocular area, initial encounter: Secondary | ICD-10-CM | POA: Diagnosis present

## 2022-03-24 DIAGNOSIS — S299XXA Unspecified injury of thorax, initial encounter: Secondary | ICD-10-CM | POA: Diagnosis not present

## 2022-03-24 DIAGNOSIS — F02A18 Dementia in other diseases classified elsewhere, mild, with other behavioral disturbance: Secondary | ICD-10-CM | POA: Diagnosis not present

## 2022-03-24 DIAGNOSIS — G9341 Metabolic encephalopathy: Secondary | ICD-10-CM | POA: Diagnosis present

## 2022-03-24 DIAGNOSIS — Z7902 Long term (current) use of antithrombotics/antiplatelets: Secondary | ICD-10-CM | POA: Diagnosis not present

## 2022-03-24 DIAGNOSIS — R519 Headache, unspecified: Secondary | ICD-10-CM | POA: Diagnosis not present

## 2022-03-24 DIAGNOSIS — N390 Urinary tract infection, site not specified: Secondary | ICD-10-CM | POA: Diagnosis not present

## 2022-03-24 DIAGNOSIS — M47816 Spondylosis without myelopathy or radiculopathy, lumbar region: Secondary | ICD-10-CM | POA: Diagnosis not present

## 2022-03-24 DIAGNOSIS — S79912A Unspecified injury of left hip, initial encounter: Secondary | ICD-10-CM | POA: Diagnosis not present

## 2022-03-24 DIAGNOSIS — Z79891 Long term (current) use of opiate analgesic: Secondary | ICD-10-CM | POA: Diagnosis not present

## 2022-03-24 DIAGNOSIS — D649 Anemia, unspecified: Secondary | ICD-10-CM | POA: Diagnosis not present

## 2022-03-24 DIAGNOSIS — F03B18 Unspecified dementia, moderate, with other behavioral disturbance: Secondary | ICD-10-CM | POA: Diagnosis present

## 2022-03-24 DIAGNOSIS — H9190 Unspecified hearing loss, unspecified ear: Secondary | ICD-10-CM | POA: Diagnosis present

## 2022-03-24 DIAGNOSIS — R0689 Other abnormalities of breathing: Secondary | ICD-10-CM | POA: Diagnosis not present

## 2022-03-24 DIAGNOSIS — E039 Hypothyroidism, unspecified: Secondary | ICD-10-CM | POA: Diagnosis not present

## 2022-03-24 DIAGNOSIS — F513 Sleepwalking [somnambulism]: Secondary | ICD-10-CM | POA: Diagnosis present

## 2022-03-24 DIAGNOSIS — Z882 Allergy status to sulfonamides status: Secondary | ICD-10-CM | POA: Diagnosis not present

## 2022-03-24 DIAGNOSIS — G928 Other toxic encephalopathy: Secondary | ICD-10-CM | POA: Diagnosis present

## 2022-03-24 DIAGNOSIS — Z66 Do not resuscitate: Secondary | ICD-10-CM | POA: Diagnosis present

## 2022-03-24 DIAGNOSIS — S42472A Displaced transcondylar fracture of left humerus, initial encounter for closed fracture: Secondary | ICD-10-CM | POA: Diagnosis not present

## 2022-03-24 DIAGNOSIS — S42412A Displaced simple supracondylar fracture without intercondylar fracture of left humerus, initial encounter for closed fracture: Principal | ICD-10-CM | POA: Diagnosis present

## 2022-03-24 DIAGNOSIS — S42442A Displaced fracture (avulsion) of medial epicondyle of left humerus, initial encounter for closed fracture: Secondary | ICD-10-CM | POA: Diagnosis not present

## 2022-03-24 DIAGNOSIS — F03B2 Unspecified dementia, moderate, with psychotic disturbance: Secondary | ICD-10-CM | POA: Diagnosis not present

## 2022-03-24 DIAGNOSIS — F03918 Unspecified dementia, unspecified severity, with other behavioral disturbance: Secondary | ICD-10-CM | POA: Diagnosis not present

## 2022-03-24 DIAGNOSIS — B9620 Unspecified Escherichia coli [E. coli] as the cause of diseases classified elsewhere: Secondary | ICD-10-CM | POA: Diagnosis present

## 2022-03-24 DIAGNOSIS — Z886 Allergy status to analgesic agent status: Secondary | ICD-10-CM

## 2022-03-24 DIAGNOSIS — E440 Moderate protein-calorie malnutrition: Secondary | ICD-10-CM | POA: Insufficient documentation

## 2022-03-24 DIAGNOSIS — S42412D Displaced simple supracondylar fracture without intercondylar fracture of left humerus, subsequent encounter for fracture with routine healing: Secondary | ICD-10-CM | POA: Diagnosis not present

## 2022-03-24 DIAGNOSIS — R109 Unspecified abdominal pain: Secondary | ICD-10-CM | POA: Diagnosis not present

## 2022-03-24 DIAGNOSIS — M19012 Primary osteoarthritis, left shoulder: Secondary | ICD-10-CM | POA: Diagnosis not present

## 2022-03-24 HISTORY — DX: Constipation, unspecified: K59.00

## 2022-03-24 HISTORY — DX: Unspecified dementia, unspecified severity, without behavioral disturbance, psychotic disturbance, mood disturbance, and anxiety: F03.90

## 2022-03-24 LAB — URINALYSIS, ROUTINE W REFLEX MICROSCOPIC
Bilirubin Urine: NEGATIVE
Glucose, UA: NEGATIVE mg/dL
Hgb urine dipstick: NEGATIVE
Ketones, ur: 20 mg/dL — AB
Nitrite: POSITIVE — AB
Protein, ur: NEGATIVE mg/dL
Specific Gravity, Urine: 1.036 — ABNORMAL HIGH (ref 1.005–1.030)
pH: 5 (ref 5.0–8.0)

## 2022-03-24 LAB — CBC WITH DIFFERENTIAL/PLATELET
Abs Immature Granulocytes: 0.02 10*3/uL (ref 0.00–0.07)
Basophils Absolute: 0 10*3/uL (ref 0.0–0.1)
Basophils Relative: 0 %
Eosinophils Absolute: 0 10*3/uL (ref 0.0–0.5)
Eosinophils Relative: 0 %
HCT: 40.9 % (ref 36.0–46.0)
Hemoglobin: 14.1 g/dL (ref 12.0–15.0)
Immature Granulocytes: 0 %
Lymphocytes Relative: 5 %
Lymphs Abs: 0.6 10*3/uL — ABNORMAL LOW (ref 0.7–4.0)
MCH: 31.3 pg (ref 26.0–34.0)
MCHC: 34.5 g/dL (ref 30.0–36.0)
MCV: 90.9 fL (ref 80.0–100.0)
Monocytes Absolute: 0.7 10*3/uL (ref 0.1–1.0)
Monocytes Relative: 6 %
Neutro Abs: 10.4 10*3/uL — ABNORMAL HIGH (ref 1.7–7.7)
Neutrophils Relative %: 89 %
Platelets: 209 10*3/uL (ref 150–400)
RBC: 4.5 MIL/uL (ref 3.87–5.11)
RDW: 13.2 % (ref 11.5–15.5)
WBC: 11.7 10*3/uL — ABNORMAL HIGH (ref 4.0–10.5)
nRBC: 0 % (ref 0.0–0.2)

## 2022-03-24 LAB — COMPREHENSIVE METABOLIC PANEL
ALT: 23 U/L (ref 0–44)
AST: 36 U/L (ref 15–41)
Albumin: 4.1 g/dL (ref 3.5–5.0)
Alkaline Phosphatase: 46 U/L (ref 38–126)
Anion gap: 11 (ref 5–15)
BUN: 17 mg/dL (ref 8–23)
CO2: 24 mmol/L (ref 22–32)
Calcium: 9.1 mg/dL (ref 8.9–10.3)
Chloride: 101 mmol/L (ref 98–111)
Creatinine, Ser: 0.83 mg/dL (ref 0.44–1.00)
GFR, Estimated: >60 mL/min (ref >60)
Glucose, Bld: 153 mg/dL — ABNORMAL HIGH (ref 70–99)
Potassium: 3.8 mmol/L (ref 3.5–5.1)
Sodium: 136 mmol/L (ref 135–145)
Total Bilirubin: 0.5 mg/dL (ref 0.3–1.2)
Total Protein: 7.1 g/dL (ref 6.5–8.1)

## 2022-03-24 LAB — TSH: TSH: 0.471 u[IU]/mL (ref 0.350–4.500)

## 2022-03-24 MED ORDER — HYDRALAZINE HCL 20 MG/ML IJ SOLN: 5.0000 mg | INTRAMUSCULAR | Status: DC | PRN

## 2022-03-24 MED ORDER — DOCUSATE SODIUM 100 MG PO CAPS
100.0000 mg | ORAL_CAPSULE | Freq: Two times a day (BID) | ORAL | Status: DC
  Administered 2022-03-24 – 2022-03-28 (×7): 100 mg via ORAL
  Filled 2022-03-24 (×8): qty 1

## 2022-03-24 MED ORDER — SODIUM CHLORIDE 0.9 % IV SOLN: 1.0000 g | INTRAVENOUS | Status: DC

## 2022-03-24 MED ORDER — ACETAMINOPHEN 325 MG PO TABS
650.0000 mg | ORAL_TABLET | Freq: Four times a day (QID) | ORAL | Status: DC | PRN
  Administered 2022-03-25 – 2022-03-26 (×2): 650 mg via ORAL
  Filled 2022-03-24 (×2): qty 2

## 2022-03-24 MED ORDER — ATORVASTATIN CALCIUM 10 MG PO TABS
10.0000 mg | ORAL_TABLET | Freq: Every day | ORAL | Status: DC
  Administered 2022-03-25 – 2022-03-28 (×4): 10 mg via ORAL
  Filled 2022-03-24 (×5): qty 1

## 2022-03-24 MED ORDER — OXYCODONE HCL 5 MG PO TABS: 5.0000 mg | ORAL_TABLET | ORAL | Status: DC | PRN

## 2022-03-24 MED ORDER — MIDAZOLAM HCL 2 MG/2ML IJ SOLN
2.0000 mg | Freq: Once | INTRAMUSCULAR | Status: AC
  Administered 2022-03-24: 2 mg via INTRAVENOUS
  Filled 2022-03-24: qty 2

## 2022-03-24 MED ORDER — IOHEXOL 350 MG/ML SOLN
75.0000 mL | Freq: Once | INTRAVENOUS | Status: AC | PRN
  Administered 2022-03-24: 75 mL via INTRAVENOUS

## 2022-03-24 MED ORDER — TRAZODONE HCL 50 MG PO TABS
50.0000 mg | ORAL_TABLET | Freq: Every evening | ORAL | Status: DC | PRN
  Administered 2022-03-26 – 2022-03-27 (×2): 50 mg via ORAL
  Filled 2022-03-24 (×2): qty 1

## 2022-03-24 MED ORDER — BISACODYL 5 MG PO TBEC: 5.0000 mg | DELAYED_RELEASE_TABLET | Freq: Every day | ORAL | Status: DC | PRN

## 2022-03-24 MED ORDER — ONDANSETRON HCL 4 MG PO TABS: 4.0000 mg | ORAL_TABLET | Freq: Four times a day (QID) | ORAL | Status: DC | PRN

## 2022-03-24 MED ORDER — ACETAMINOPHEN 650 MG RE SUPP: 650.0000 mg | Freq: Four times a day (QID) | RECTAL | Status: DC | PRN

## 2022-03-24 MED ORDER — TRAMADOL HCL 50 MG PO TABS
50.0000 mg | ORAL_TABLET | Freq: Four times a day (QID) | ORAL | Status: DC
  Administered 2022-03-25 – 2022-03-28 (×13): 50 mg via ORAL
  Filled 2022-03-24 (×13): qty 1

## 2022-03-24 MED ORDER — POLYETHYLENE GLYCOL 3350 17 G PO PACK
17.0000 g | PACK | Freq: Two times a day (BID) | ORAL | Status: DC
  Administered 2022-03-24 – 2022-03-28 (×5): 17 g via ORAL
  Filled 2022-03-24 (×8): qty 1

## 2022-03-24 MED ORDER — ENOXAPARIN SODIUM 40 MG/0.4ML IJ SOSY
40.0000 mg | PREFILLED_SYRINGE | INTRAMUSCULAR | Status: DC
  Administered 2022-03-25 – 2022-03-28 (×4): 40 mg via SUBCUTANEOUS
  Filled 2022-03-24 (×5): qty 0.4

## 2022-03-24 MED ORDER — ONDANSETRON HCL 4 MG/2ML IJ SOLN: 4.0000 mg | Freq: Four times a day (QID) | INTRAMUSCULAR | Status: DC | PRN

## 2022-03-24 MED ORDER — SODIUM CHLORIDE 0.9 % IV SOLN
2.0000 g | Freq: Once | INTRAVENOUS | Status: AC
  Administered 2022-03-24: 2 g via INTRAVENOUS
  Filled 2022-03-24: qty 20

## 2022-03-24 NOTE — Progress Notes (Signed)
Orthopedic Tech Progress Note Patient Details:  Ashley Chambers 08-23-1930 [id number]   Splint was applied in plaster. Splint was well padded with multiple layers of web roll. A side strut was added to the splint by request from the MD. The side strut was added to ensure a 90 degree angle and was also applied in plaster. Pt had full capability of their hand and was put in an arm sling after the splint was applied.    Ortho Devices Type of Ortho Device: Post (long arm) splint Ortho Device/Splint Location: LUE Ortho Device/Splint Interventions: Ordered, Application, Adjustment   Post Interventions Patient Tolerated: Well Instructions Provided: Adjustment of device, Care of device  Georg Ruddle 03/24/2022, 5:48 PM

## 2022-03-24 NOTE — Progress Notes (Addendum)
Orthopaedic Trauma Service  Consult for L distal humerus fracture Very good alignment on plain film  Recommend CT of Left elbow and posterior long arm splint  If CT demonstrates similar alignment as the plain films we could seriously consider non-op though her history of dementia is worrisome for maintaining compliance but surgery certainly doesn't negate this   This is a fragility fracture given mechanism  Looks like pt has a history of bisphosphate use dating back to 2014  Other questions to answer are hand dominance and if pt uses ambulatory aides, a walker in particular   Full consult to follow   Ok for diet from ortho standpoint   Can follow up as an outpatient with ortho as well   Mearl Latin, PA-C [phone] (C) 03/24/2022, 4:08 PM  [facility] [address] [place] [address] [phone] Collier Bullock (F)      Patient ID: Ashley Chambers, female   DOB: 08/02/1930, 88 y.o.   MRN: [id number]
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
## 2022-03-24 NOTE — ED Notes (Signed)
MD Kommor at bedside to complete fecal disimpaction. Large amount of stool removed.

## 2022-03-24 NOTE — ED Provider Notes (Addendum)
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 underlying rhythm of: NSR  Social Determinants of Health:  Factors impacting patients care include: Lives at home with grandson   Reevaluation: After the interventions noted above, I reevaluated the patient and found that they have :improved  Co morbidities that complicate the patient evaluation  Past Medical History:  Diagnosis Date   High cholesterol    Peptic ulcer    Thyroid disease  Dispostion: I considered admission for this patient, and due to altered mental status and, UTI, patient require hospital admission     Final Clinical Impression(s) / ED Diagnoses Final diagnoses:  None     @PCDICTATION @    , MD 03/24/22 1742    14/03/23, MD 03/24/22 1845

## 2022-03-24 NOTE — ED Notes (Signed)
Called lab to add on TSH he stated he could.

## 2022-03-24 NOTE — Consult Note (Signed)
Orthopaedic Trauma Service (OTS) Consult   Patient ID: Ashley Chambers MRN: [id number] DOB/AGE: [birth year] 88 y.o.   Reason for Consult: Left distal humerus fracture Referring Physician: Glendora Score, MD (EDP)   HPI: Ashley Chambers is an 88 y.o. right-hand-dominant female who sustained a fall yesterday evening.  It was unwitnessed.  Patient lives with her grandson.  They believe that she fell going to the bathroom.  She was brought to [facility] for evaluation.  She was found to have a UTI as well as significant fecal impaction additionally she was noted to have a left distal humerus fracture.  Patient's family is at bedside.  They report that she does not typically use any assistive devices to ambulate although she does have a cane and walker but does not use them often times just carries a cane.  Again she does live with her grandson but really is unable to provide regular support for her at home.  Family reports that meals are delivered to her home patient does not cook.  Patient does not remember much of her fall.  She reports minimal pain in her left elbow.  Denies any numbness or tingling.  She does have some dementia  She is not on any anticoagulants  Past Medical History:  Diagnosis Date   High cholesterol    Peptic ulcer    Thyroid disease     Past Surgical History:  Procedure Laterality Date   COLONOSCOPY N/A 01/04/2013   Procedure: COLONOSCOPY;  Surgeon: Petra Kuba, MD;  Location: [facility];  Service: Endoscopy;  Laterality: N/A;   ESOPHAGOGASTRODUODENOSCOPY N/A 12/30/2012   Procedure: ESOPHAGOGASTRODUODENOSCOPY (EGD);  Surgeon: Vertell Novak., MD;  Location: [facility];  Service: Endoscopy;  Laterality: N/A;   ESOPHAGOGASTRODUODENOSCOPY N/A 01/03/2013   Procedure: ESOPHAGOGASTRODUODENOSCOPY (EGD);  Surgeon: Vertell Novak., MD;  Location: [facility];  Service: Endoscopy;  Laterality: N/A;   ESOPHAGOGASTRODUODENOSCOPY N/A 01/12/2013    Procedure: ESOPHAGOGASTRODUODENOSCOPY (EGD);  Surgeon: Shirley Friar, MD;  Location: [facility];  Service: Endoscopy;  Laterality: N/A;   FLEXIBLE SIGMOIDOSCOPY N/A 12/30/2012   Procedure: FLEXIBLE SIGMOIDOSCOPY;  Surgeon: Vertell Novak., MD;  Location: [facility];  Service: Endoscopy;  Laterality: N/A;   HERNIA REPAIR     TONSILLECTOMY      Family History  Problem Relation Age of Onset   Leukemia Mother    Aneurysm Father     Social History:  reports that she has never smoked. She has never used smokeless tobacco. She reports that she does not drink alcohol and does not use drugs.  Allergies:  Allergies  Allergen Reactions   Nsaids Other (See Comments)    Gi bleeding   Tolmetin Other (See Comments)    Gi bleeding   Sulfa Antibiotics Other (See Comments)    Unknown    Medications: I have reviewed the patient's current medications. Current Outpatient Medications  Medication Instructions   acetaminophen (TYLENOL) 650 mg, Oral, Every 6 hours   alendronate (FOSAMAX) 70 mg, Oral, Every Sun, Take with a full glass of water on an empty stomach.   atorvastatin (LIPITOR) 10 mg, Oral, Daily   docusate sodium (COLACE) 100 mg, Oral, 2 times daily   enoxaparin (LOVENOX) 40 mg, Subcutaneous, Daily   methocarbamol (ROBAXIN) 500 mg, Oral, Every 8 hours PRN   Multiple Vitamin (MULTIVITAMIN WITH MINERALS) TABS tablet 1 tablet, Oral, Every morning   Naphazoline HCl (CLEAR EYES OP) 2 drops, Both  Eyes, Daily PRN   ondansetron (ZOFRAN-ODT) 4 mg, Oral, Every 6 hours PRN   traMADol (ULTRAM) 50 mg, Oral, Every 6 hours   Vitamin D3 400 Units, Oral, Every morning     Results for orders placed or performed during the hospital encounter of 03/24/22 (from the past 48 hour(s))  Comprehensive metabolic panel     Status: Abnormal   Collection Time: 03/24/22 11:58 AM  Result Value Ref Range   Sodium 136 135 - 145 mmol/L   Potassium 3.8 3.5 - 5.1 mmol/L   Chloride 101 98 - 111 mmol/L    CO2 24 22 - 32 mmol/L   Glucose, Bld 153 (H) 70 - 99 mg/dL    Comment: Glucose reference range applies only to samples taken after fasting for at least 8 hours.   BUN 17 8 - 23 mg/dL   Creatinine, Ser 4.78 0.44 - 1.00 mg/dL   Calcium 9.1 8.9 - 29.5 mg/dL   Total Protein 7.1 6.5 - 8.1 g/dL   Albumin 4.1 3.5 - 5.0 g/dL   AST 36 15 - 41 U/L   ALT 23 0 - 44 U/L   Alkaline Phosphatase 46 38 - 126 U/L   Total Bilirubin 0.5 0.3 - 1.2 mg/dL   GFR, Estimated >62 >13 mL/min    Comment: (NOTE) Calculated using the CKD-EPI Creatinine Equation (2021)    Anion gap 11 5 - 15    Comment: Performed at [facility], [address], [place], [place] [address]  CBC with Differential     Status: Abnormal   Collection Time: 03/24/22 11:58 AM  Result Value Ref Range   WBC 11.7 (H) 4.0 - 10.5 K/uL   RBC 4.50 3.87 - 5.11 MIL/uL   Hemoglobin 14.1 12.0 - 15.0 g/dL   HCT 84.6 96.2 - 95.2 %   MCV 90.9 80.0 - 100.0 fL   MCH 31.3 26.0 - 34.0 pg   MCHC 34.5 30.0 - 36.0 g/dL   RDW 84.1 32.4 - 40.1 %   Platelets 209 150 - 400 K/uL   nRBC 0.0 0.0 - 0.2 %   Neutrophils Relative % 89 %   Neutro Abs 10.4 (H) 1.7 - 7.7 K/uL   Lymphocytes Relative 5 %   Lymphs Abs 0.6 (L) 0.7 - 4.0 K/uL   Monocytes Relative 6 %   Monocytes Absolute 0.7 0.1 - 1.0 K/uL   Eosinophils Relative 0 %   Eosinophils Absolute 0.0 0.0 - 0.5 K/uL   Basophils Relative 0 %   Basophils Absolute 0.0 0.0 - 0.1 K/uL   Immature Granulocytes 0 %   Abs Immature Granulocytes 0.02 0.00 - 0.07 K/uL    Comment: Performed at [facility], [address] [place], [place] [address]  Urinalysis, Routine w reflex microscopic Urine, Clean Catch     Status: Abnormal   Collection Time: 03/24/22 11:58 AM  Result Value Ref Range   Color, Urine YELLOW YELLOW   APPearance HAZY (A) CLEAR   Specific Gravity, Urine 1.036 (H) 1.005 - 1.030   pH 5.0 5.0 - 8.0   Glucose, UA NEGATIVE NEGATIVE mg/dL   Hgb urine dipstick NEGATIVE NEGATIVE   Bilirubin  Urine NEGATIVE NEGATIVE   Ketones, ur 20 (A) NEGATIVE mg/dL   Protein, ur NEGATIVE NEGATIVE mg/dL   Nitrite POSITIVE (A) NEGATIVE   Leukocytes,Ua SMALL (A) NEGATIVE   RBC / HPF 0-5 0 - 5 RBC/hpf   WBC, UA 11-20 0 - 5 WBC/hpf   Bacteria, UA  RARE (A) NONE SEEN   Mucus PRESENT     Comment: Performed at [facility], [address], [place], [place] [address]    CT ELBOW LEFT WO CONTRAST  Result Date: 03/24/2022 CLINICAL DATA:  Distal humerus fracture EXAM: CT OF THE UPPER LEFT EXTREMITY WITHOUT CONTRAST TECHNIQUE: Multidetector CT imaging of the upper left extremity was performed according to the standard protocol. RADIATION DOSE REDUCTION: This exam was performed according to the departmental dose-optimization program which includes automated exposure control, adjustment of the mA and/or kV according to patient size and/or use of iterative reconstruction technique. COMPARISON:  Same-day x-ray FINDINGS: Bones/Joint/Cartilage Acute transversely oriented fracture through the supracondylar aspect of the distal left humerus. There is intra-articular extension through the trochlea (series 6, image 26). 6 mm of medial displacement of the medial epicondylar fracture component. No significant angulation. The proximal radius and ulna are intact without fracture. No dislocation. Large elbow joint hemarthrosis. Ligaments Suboptimally assessed by CT. Muscles and Tendons Enlargement of the flexor compartment musculature of the proximal forearm, likely component of intramuscular hematoma. Soft tissues Extensive soft tissue swelling and ill-defined hematoma about the elbow, which is most pronounced medially. IMPRESSION: 1. Acute mildly displaced intra-articular fracture of the distal left humerus, as described. 2. Large elbow joint hemarthrosis. 3. Enlargement of the flexor compartment musculature of the proximal forearm, likely component of intramuscular hematoma. 4. Extensive soft tissue swelling and ill-defined  hematoma about the elbow, which is most pronounced medially. Electronically Signed   By: Duanne Guess D.O.   On: 03/24/2022 17:02   CT HEAD WO CONTRAST ( )  Result Date: 03/24/2022 CLINICAL DATA:  88 year old female with head, neck and face pain following fall. EXAM: CT HEAD WITHOUT CONTRAST CT MAXILLOFACIAL WITHOUT CONTRAST CT CERVICAL SPINE WITHOUT CONTRAST TECHNIQUE: Multidetector CT imaging of the head, cervical spine, and maxillofacial structures were performed using the standard protocol without intravenous contrast. Multiplanar CT image reconstructions of the cervical spine and maxillofacial structures were also generated. RADIATION DOSE REDUCTION: This exam was performed according to the departmental dose-optimization program which includes automated exposure control, adjustment of the mA and/or kV according to patient size and/or use of iterative reconstruction technique. COMPARISON:  11/11/2017 CT FINDINGS: CT HEAD FINDINGS Brain: No evidence of acute infarction, hemorrhage, hydrocephalus, extra-axial collection or mass lesion/mass effect. Atrophy, remote LEFT basal ganglia/internal capsule infarct and chronic small-vessel white matter ischemic changes again noted. Vascular: Carotid and vertebral atherosclerotic calcifications are noted. Skull: Normal. Negative for fracture or focal lesion. Other: None. CT MAXILLOFACIAL FINDINGS Osseous: No fracture or mandibular dislocation. No destructive process. Orbits: Negative. No traumatic or inflammatory finding. Sinuses: Mild frothy material within several paranasal sinuses again noted. Nasal septal deviation to the RIGHT is unchanged. Soft tissues: Negative. CT CERVICAL SPINE FINDINGS Alignment: Normal. Skull base and vertebrae: No acute fracture. No primary bone lesion or focal pathologic process. Soft tissues and spinal canal: No prevertebral fluid or swelling. No visible canal hematoma. Disc levels: Mild to moderate degenerative disc  disease/spondylosis noted, greatest at C4-5 and C5-6 contributing to mild central spinal and bony foraminal narrowing at these levels. Upper chest: No acute abnormality Other: None IMPRESSION: 1. No evidence of acute intracranial abnormality. Atrophy, remote LEFT basal ganglia/internal capsule infarct and chronic small-vessel white matter ischemic changes. 2. No evidence of acute facial fracture. 3. No evidence of acute injury to the cervical spine. Mild to moderate degenerative disc disease/spondylosis, greatest at C4-5 and C5-6 contributing to mild central spinal and bony foraminal narrowing at  these levels. Electronically Signed   By: Harmon Pier M.D.   On: 03/24/2022 15:13   CT Cervical Spine Wo Contrast  Result Date: 03/24/2022 CLINICAL DATA:  88 year old female with head, neck and face pain following fall. EXAM: CT HEAD WITHOUT CONTRAST CT MAXILLOFACIAL WITHOUT CONTRAST CT CERVICAL SPINE WITHOUT CONTRAST TECHNIQUE: Multidetector CT imaging of the head, cervical spine, and maxillofacial structures were performed using the standard protocol without intravenous contrast. Multiplanar CT image reconstructions of the cervical spine and maxillofacial structures were also generated. RADIATION DOSE REDUCTION: This exam was performed according to the departmental dose-optimization program which includes automated exposure control, adjustment of the mA and/or kV according to patient size and/or use of iterative reconstruction technique. COMPARISON:  11/11/2017 CT FINDINGS: CT HEAD FINDINGS Brain: No evidence of acute infarction, hemorrhage, hydrocephalus, extra-axial collection or mass lesion/mass effect. Atrophy, remote LEFT basal ganglia/internal capsule infarct and chronic small-vessel white matter ischemic changes again noted. Vascular: Carotid and vertebral atherosclerotic calcifications are noted. Skull: Normal. Negative for fracture or focal lesion. Other: None. CT MAXILLOFACIAL FINDINGS Osseous: No fracture  or mandibular dislocation. No destructive process. Orbits: Negative. No traumatic or inflammatory finding. Sinuses: Mild frothy material within several paranasal sinuses again noted. Nasal septal deviation to the RIGHT is unchanged. Soft tissues: Negative. CT CERVICAL SPINE FINDINGS Alignment: Normal. Skull base and vertebrae: No acute fracture. No primary bone lesion or focal pathologic process. Soft tissues and spinal canal: No prevertebral fluid or swelling. No visible canal hematoma. Disc levels: Mild to moderate degenerative disc disease/spondylosis noted, greatest at C4-5 and C5-6 contributing to mild central spinal and bony foraminal narrowing at these levels. Upper chest: No acute abnormality Other: None IMPRESSION: 1. No evidence of acute intracranial abnormality. Atrophy, remote LEFT basal ganglia/internal capsule infarct and chronic small-vessel white matter ischemic changes. 2. No evidence of acute facial fracture. 3. No evidence of acute injury to the cervical spine. Mild to moderate degenerative disc disease/spondylosis, greatest at C4-5 and C5-6 contributing to mild central spinal and bony foraminal narrowing at these levels. Electronically Signed   By: Harmon Pier M.D.   On: 03/24/2022 15:13   CT Maxillofacial Wo Contrast  Result Date: 03/24/2022 CLINICAL DATA:  88 year old female with head, neck and face pain following fall. EXAM: CT HEAD WITHOUT CONTRAST CT MAXILLOFACIAL WITHOUT CONTRAST CT CERVICAL SPINE WITHOUT CONTRAST TECHNIQUE: Multidetector CT imaging of the head, cervical spine, and maxillofacial structures were performed using the standard protocol without intravenous contrast. Multiplanar CT image reconstructions of the cervical spine and maxillofacial structures were also generated. RADIATION DOSE REDUCTION: This exam was performed according to the departmental dose-optimization program which includes automated exposure control, adjustment of the mA and/or kV according to patient size  and/or use of iterative reconstruction technique. COMPARISON:  11/11/2017 CT FINDINGS: CT HEAD FINDINGS Brain: No evidence of acute infarction, hemorrhage, hydrocephalus, extra-axial collection or mass lesion/mass effect. Atrophy, remote LEFT basal ganglia/internal capsule infarct and chronic small-vessel white matter ischemic changes again noted. Vascular: Carotid and vertebral atherosclerotic calcifications are noted. Skull: Normal. Negative for fracture or focal lesion. Other: None. CT MAXILLOFACIAL FINDINGS Osseous: No fracture or mandibular dislocation. No destructive process. Orbits: Negative. No traumatic or inflammatory finding. Sinuses: Mild frothy material within several paranasal sinuses again noted. Nasal septal deviation to the RIGHT is unchanged. Soft tissues: Negative. CT CERVICAL SPINE FINDINGS Alignment: Normal. Skull base and vertebrae: No acute fracture. No primary bone lesion or focal pathologic process. Soft tissues and spinal canal: No prevertebral fluid or swelling. No visible  canal hematoma. Disc levels: Mild to moderate degenerative disc disease/spondylosis noted, greatest at C4-5 and C5-6 contributing to mild central spinal and bony foraminal narrowing at these levels. Upper chest: No acute abnormality Other: None IMPRESSION: 1. No evidence of acute intracranial abnormality. Atrophy, remote LEFT basal ganglia/internal capsule infarct and chronic small-vessel white matter ischemic changes. 2. No evidence of acute facial fracture. 3. No evidence of acute injury to the cervical spine. Mild to moderate degenerative disc disease/spondylosis, greatest at C4-5 and C5-6 contributing to mild central spinal and bony foraminal narrowing at these levels. Electronically Signed   By: Harmon Pier M.D.   On: 03/24/2022 15:13   CT CHEST ABDOMEN PELVIS W CONTRAST  Result Date: 03/24/2022 CLINICAL DATA:  88 year old female with fall and diffuse pain. EXAM: CT CHEST, ABDOMEN, AND PELVIS WITH CONTRAST  TECHNIQUE: Multidetector CT imaging of the chest, abdomen and pelvis was performed following the standard protocol during bolus administration of intravenous contrast. RADIATION DOSE REDUCTION: This exam was performed according to the departmental dose-optimization program which includes automated exposure control, adjustment of the mA and/or kV according to patient size and/or use of iterative reconstruction technique. CONTRAST:  75mL OMNIPAQUE IOHEXOL 350 MG/ML SOLN COMPARISON:  11/11/2017 CT and prior studies FINDINGS: CT CHEST FINDINGS Cardiovascular: UPPER limits normal heart size again noted. Aortic atherosclerotic calcifications again noted. There is no evidence of thoracic aortic aneurysm or pericardial effusion. Mediastinum/Nodes: No enlarged mediastinal, hilar, or axillary lymph nodes. Heterogeneous enlarged LEFT thyroid has previously been evaluated sample. The trachea and esophagus demonstrate no significant findings. Lungs/Pleura: There is no evidence airspace disease, consolidation, mass, suspicious nodule, pleural effusion or pneumothorax. Mild dependent/bibasilar scarring/atelectasis noted. Mild biapical pleuroparenchymal scarring again identified. Musculoskeletal: Fractures of the posterior LEFT 11th and 12th ribs are age indeterminate. No other acute or suspicious bony abnormalities noted. Remote sternal and RIGHT rib fractures are noted. CT ABDOMEN PELVIS FINDINGS Hepatobiliary: Probable mild hepatic steatosis noted. No focal hepatic abnormalities are present. Gallbladder is unremarkable. There is no evidence of intrahepatic or extrahepatic biliary dilatation. Pancreas: No significant abnormality Spleen: Unremarkable Adrenals/Urinary Tract: The kidneys, adrenal glands and bladder are unremarkable. Stomach/Bowel: There is no evidence of bowel obstruction, bowel wall thickening or inflammatory changes. A moderate to large of stool within the colon is noted with very large amount of stool within the  rectum. The appendix is normal. The stomach is unremarkable. Vascular/Lymphatic: Aortic atherosclerosis. No enlarged abdominal or pelvic lymph nodes. Reproductive: No significant abnormality Other: No ascites, focal collection or pneumoperitoneum. Musculoskeletal: No acute or suspicious bony abnormalities are noted. Remote pelvic fractures and degenerative changes in the LOWER lumbar spine again noted. IMPRESSION: 1. Age-indeterminate fractures of the posterior LEFT 11th and 12th ribs. No other evidence of acute injury within the chest, abdomen or pelvis. 2. Moderate to large colonic stool and very large amount of rectal stool. 3. Probable mild hepatic steatosis. Electronically Signed   By: Harmon Pier M.D.   On: 03/24/2022 15:01   DG Shoulder Left  Result Date: 03/24/2022 CLINICAL DATA:  Trauma, fall EXAM: LEFT SHOULDER - 2+ VIEW COMPARISON:  None Available. FINDINGS: No displaced fracture or dislocation is seen. Osteopenia is seen in bony structures. There are no abnormal soft tissue calcifications. Visualized lung fields are clear. Small sclerotic density in glenoid may suggest benign bone island. Degenerative changes are noted in the visualized lower cervical spine. IMPRESSION: No fracture or dislocation is seen in left shoulder. Cervical spondylosis. Electronically Signed   By: Harlan Stains.D.  On: 03/24/2022 13:50   DG Hip Unilat W or Wo Pelvis 2-3 Views Left  Result Date: 03/24/2022 CLINICAL DATA:  Trauma, fall EXAM: DG HIP (WITH OR WITHOUT PELVIS) 2-3V LEFT COMPARISON:  11/11/2017 FINDINGS: No recent displaced fracture or dislocation is seen. In the frogleg lateral view, small smooth marginated calcifications are noted adjacent to the upper aspect of greater trochanter which was also seen in the previous examination. In the same view, there is possible break in the cortical margin in the upper lateral aspect of greater trochanter in the left femur. Deformities are noted in superior and  inferior pubic rami on both sides consistent with old healed fractures. Fracture lines seen in right acetabulum in the previous study is not evident in the current study. Marked degenerative changes are noted in lower lumbar spine. IMPRESSION: No recent displaced fracture or dislocation is seen in pelvis and left hip. There is possible break in the cortical margin in the upper lateral aspect of greater trochanter of proximal left femur seen in a single view. This may suggest recent or old undisplaced fracture. Please correlate with clinical symptoms and physical examination findings. There are multiple old healed fractures in both pubic bones. Lumbar spondylosis. Electronically Signed   By: Ernie AvenaPalani  Rathinasamy M.D.   On: 03/24/2022 13:48   DG Elbow 2 Views Left  Result Date: 03/24/2022 CLINICAL DATA:  Fall, elbow pain EXAM: LEFT ELBOW - 2 VIEW COMPARISON:  None Available. FINDINGS: Acute nondisplaced supracondylar fracture of the distal left humerus. Large elbow joint lipohemarthrosis. No dislocation. Diffuse soft tissue swelling about the elbow, most pronounced medially. IMPRESSION: Acute nondisplaced supracondylar fracture of the distal left humerus. Electronically Signed   By: Duanne GuessNicholas  Plundo D.O.   On: 03/24/2022 13:38   DG Chest Portable 1 View  Result Date: 03/24/2022 CLINICAL DATA:  Trauma, fall EXAM: PORTABLE CHEST 1 VIEW COMPARISON:  11/11/2017 FINDINGS: The heart size and mediastinal contours are within normal limits. Both lungs are clear. The visualized skeletal structures are unremarkable. IMPRESSION: No active disease. Electronically Signed   By: Ernie AvenaPalani  Rathinasamy M.D.   On: 03/24/2022 13:38    Intake/Output    None      Review of Systems  Constitutional:  Negative for chills and fever.  Respiratory:  Negative for shortness of breath and wheezing.   Cardiovascular:  Negative for chest pain and palpitations.  Gastrointestinal:  Positive for constipation. Negative for abdominal pain,  nausea and vomiting.  Musculoskeletal:        Left elbow pain  Neurological:  Negative for tingling and sensory change.   Blood pressure 137/70, pulse 86, temperature 97.7 F (36.5 C), temperature source Oral, resp. rate 17, SpO2 96 %. Physical Exam Vitals and nursing note reviewed.  Constitutional:      General: She is awake.     Comments: Pleasant elderly female.  Generalized sarcopenia  HENT:     Head:     Comments: Ecchymosis noted to the left periorbital region Cardiovascular:     Heart sounds: S1 normal and S2 normal.  Pulmonary:     Comments: Unlabored Musculoskeletal:     Comments: Left upper extremity Swelling noted to the left elbow this is ecchymosis No significant swelling elsewhere. No traumatic or open wounds Radial, ulnar, median nerve motor and sensory function intact AIN and PIN motor functions intact No shoulder tenderness No wrist or hand tenderness No forearm tenderness + Radial pulse Compartments are soft Elbow not manipulated due to known fracture  Skin:  General: Skin is warm.     Capillary Refill: Capillary refill takes less than 2 seconds.  Neurological:     Mental Status: She is alert.  Psychiatric:        Attention and Perception: Attention normal.        Mood and Affect: Mood normal.        Behavior: Behavior is cooperative.     Assessment/Plan:  88 year old female ground-level fall with left distal humerus fracture  -Low T-type both column distal humerus fracture   Overall alignment is reasonable despite intra-articular communication  Plan for posterior long-arm splint with side strut  Could conceivably treat this nonoperatively given age and activity level  She is at risk for complications with or without surgery  Body habitus is definitely amenable to bracing if nonoperative treatment pursued   Will have further discussion with family tomorrow   Ice, elevation and sling for the time being  NWB L UEx   PT/OT evals    Will  likely need SNF at dc     - Pain management:  Multimodal, minimize narcotics - Medical issues   Per primary - DVT/PE prophylaxis:  Does not require anticoagulation from orthopedic standpoint  - Metabolic Bone Disease:  Patient on Boniva currently.  Looks like she was on Fosamax prior to this.  Would recommend cessation of all bisphosphonate medications.  Looks like she has been on bisphosphonate since 2014.  Would actually commend drug holiday at this point given nearly 10 years of use.  Would also recommend drug holiday with her acute fracture to optimize healing potential  - Activity:  Up with assistance  Nonweightbearing left upper extremity  - FEN/GI prophylaxis/Foley/Lines:  Ok for diet from ortho standpoint   RD consult to optimize nutrition   - Dispo:  Admit for IV abx for UTI   Therapy evals   Might be time to consider ALF/memory care unit      Mearl Latin, PA-C [phone] (C) 03/24/2022, 5:49 PM  [facility] [address] [place] [address] [phone] Collier Bullock (F)    After 5pm and on the weekends please log on to Am[employer], go to orthopaedics and the look under the [facility] Call for the provider(s) on call. You can also call our office at [phone] and then follow the prompts to be connected to the call team.

## 2022-03-24 NOTE — H&P (Signed)
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  DNR -I have discussed code status with the patient's son and DIL and they are in agreement that the patient would not desire resuscitation and would prefer to die a natural death should that situation arise. -She will need a gold out of facility DNR form at the time of discharge     Advance Care Planning:   Code Status: DNR   Consults: Orthopedics; TOC team; nutitrion; PT/OT; speech therapy (cognitive/language evaluation)  DVT Prophylaxis: Lovenox  Family Communication: Son and DIL were present throughout evaluation  Severity of Illness: The appropriate patient status for this patient is INPATIENT. Inpatient status is judged to be reasonable and necessary in order to provide the required intensity of service to ensure the patient's safety. The patient's presenting symptoms, physical exam findings, and initial radiographic and laboratory data in the context of their chronic comorbidities is felt to place them at high risk for further clinical deterioration. Furthermore, it is not anticipated that the patient will be medically stable for  discharge from the hospital within 2 midnights of admission.   * I certify that at the point of admission it is my clinical judgment that the patient will require inpatient hospital care spanning beyond 2 midnights from the point of admission due to high intensity of service, high risk for further deterioration and high frequency of surveillance required.*  Author: Karmen Bongo, MD 03/24/2022 6:23 PM  For on call review [email or web address].

## 2022-03-24 NOTE — ED Notes (Signed)
Small amount of stool removed with enema, appears to still be stool present in rectum. Small amount of bleeding from irritation, pt unable to tolerate enema any longer. MD Ophelia Charter made aware.

## 2022-03-25 ENCOUNTER — Encounter (HOSPITAL_COMMUNITY): Payer: Self-pay | Admitting: Internal Medicine

## 2022-03-25 DIAGNOSIS — E440 Moderate protein-calorie malnutrition: Secondary | ICD-10-CM | POA: Insufficient documentation

## 2022-03-25 DIAGNOSIS — G9341 Metabolic encephalopathy: Secondary | ICD-10-CM

## 2022-03-25 LAB — BASIC METABOLIC PANEL
Anion gap: 9 (ref 5–15)
BUN: 12 mg/dL (ref 8–23)
CO2: 26 mmol/L (ref 22–32)
Calcium: 9.1 mg/dL (ref 8.9–10.3)
Chloride: 105 mmol/L (ref 98–111)
Creatinine, Ser: 0.68 mg/dL (ref 0.44–1.00)
GFR, Estimated: >60 mL/min (ref >60)
Glucose, Bld: 124 mg/dL — ABNORMAL HIGH (ref 70–99)
Potassium: 3.7 mmol/L (ref 3.5–5.1)
Sodium: 140 mmol/L (ref 135–145)

## 2022-03-25 LAB — CBC
HCT: 38.3 % (ref 36.0–46.0)
Hemoglobin: 13.1 g/dL (ref 12.0–15.0)
MCH: 30.8 pg (ref 26.0–34.0)
MCHC: 34.2 g/dL (ref 30.0–36.0)
MCV: 90.1 fL (ref 80.0–100.0)
Platelets: 192 10*3/uL (ref 150–400)
RBC: 4.25 MIL/uL (ref 3.87–5.11)
RDW: 13.6 % (ref 11.5–15.5)
WBC: 9.8 10*3/uL (ref 4.0–10.5)
nRBC: 0 % (ref 0.0–0.2)

## 2022-03-25 LAB — VITAMIN D 25 HYDROXY (VIT D DEFICIENCY, FRACTURES): Vit D, 25-Hydroxy: 40.37 ng/mL (ref 30–100)

## 2022-03-25 MED ORDER — CEFADROXIL 500 MG PO CAPS
500.0000 mg | ORAL_CAPSULE | Freq: Two times a day (BID) | ORAL | Status: DC
  Administered 2022-03-25 – 2022-03-28 (×6): 500 mg via ORAL
  Filled 2022-03-25 (×6): qty 1

## 2022-03-25 MED ORDER — ADULT MULTIVITAMIN W/MINERALS CH
1.0000 | ORAL_TABLET | Freq: Every day | ORAL | Status: DC
  Administered 2022-03-25 – 2022-03-28 (×4): 1 via ORAL
  Filled 2022-03-25 (×5): qty 1

## 2022-03-25 MED ORDER — LACTATED RINGERS IV SOLN: INTRAVENOUS | Status: DC

## 2022-03-25 MED ORDER — ENSURE ENLIVE PO LIQD
237.0000 mL | Freq: Two times a day (BID) | ORAL | Status: DC
  Administered 2022-03-25 – 2022-03-28 (×6): 237 mL via ORAL

## 2022-03-25 MED ORDER — ORAL CARE MOUTH RINSE: 15.0000 mL | OROMUCOSAL | Status: DC | PRN

## 2022-03-25 MED ORDER — BISACODYL 10 MG RE SUPP
10.0000 mg | Freq: Once | RECTAL | Status: AC
  Administered 2022-03-25: 10 mg via RECTAL
  Filled 2022-03-25: qty 1

## 2022-03-25 MED ORDER — CEPHALEXIN 500 MG PO CAPS: 500.0000 mg | ORAL_CAPSULE | Freq: Four times a day (QID) | ORAL | Status: DC

## 2022-03-25 NOTE — Assessment & Plan Note (Signed)
Not on LT4, TSH normal.
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
## 2022-03-25 NOTE — Progress Notes (Signed)
Initial Nutrition Assessment  DOCUMENTATION CODES:   Non-severe (moderate) malnutrition in context of chronic illness  INTERVENTION:  - Add Ensure Enlive po BID, each supplement provides 350 kcal and 20 grams of protein.   - Add MVI q day.   NUTRITION DIAGNOSIS:   Moderate Malnutrition related to chronic illness as evidenced by moderate fat depletion, moderate muscle depletion, energy intake < 75% for > or equal to 1 month.  GOAL:   Patient will meet greater than or equal to 90% of their needs  MONITOR:   PO intake, Supplement acceptance  REASON FOR ASSESSMENT:   Consult Assessment of nutrition requirement/status  ASSESSMENT:   88 y.o. female admits related to fall. PMH includes: HLD, dementia, and hypothyroidism.  Meds include: colace, miralax. IVF: LR @ 100 mL/hr. Labs reviewed: WNL.   The pt reports that she has been eating fair since admission. She states that the meals here are way more food than what she typically eats. Pt reports that she typically only eats one meal per day with some snacks at home. Wts are stable per record. Pt does report that she will sometimes drink a protein shake at home. RD will add Ensure BID for now and continue to monitor PO intakes.   NUTRITION - FOCUSED PHYSICAL EXAM:  Flowsheet Row Most Recent Value  Orbital Region Mild depletion  Upper Arm Region Moderate depletion  Thoracic and Lumbar Region Unable to assess  Buccal Region Mild depletion  Temple Region Mild depletion  Clavicle Bone Region Mild depletion  Clavicle and Acromion Bone Region Mild depletion  Scapular Bone Region Unable to assess  Dorsal Hand Moderate depletion  Patellar Region Moderate depletion  Anterior Thigh Region Moderate depletion  Posterior Calf Region Moderate depletion  Edema (RD Assessment) None  Hair Reviewed  Eyes Reviewed  Mouth Reviewed  Skin Reviewed  Nails Reviewed       Diet Order:   Diet Order             Diet regular Room service  appropriate? Yes; Fluid consistency: Thin  Diet effective now                   EDUCATION NEEDS:   Not appropriate for education at this time  Skin:  Skin Assessment: Reviewed RN Assessment  Last BM:  03/24/22  Height:   Ht Readings from Last 1 Encounters:  03/25/22 5\' 5"  (1.651 m)    Weight:   Wt Readings from Last 1 Encounters:  03/25/22 52 kg    Ideal Body Weight:     BMI:  Body mass index is 19.08 kg/m.  Estimated Nutritional Needs:   Kcal:  1300-1560 kcals  Protein:  65-80 gm  Fluid:  >/= 1.3 L  14/04/23, RD, LDN, CNSC

## 2022-03-25 NOTE — Assessment & Plan Note (Signed)
Due to [place]. - Standard delirium precautions: blinds open and lights on during day, TV off, minimize interruptions at night, glasses/hearing aids, PT/OT, avoiding [place] list medications
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
## 2022-03-25 NOTE — Assessment & Plan Note (Signed)
Due to Ambien - Stop Ambien

## 2022-03-25 NOTE — Assessment & Plan Note (Signed)
Continue bowel regimen. ?

## 2022-03-25 NOTE — Assessment & Plan Note (Addendum)
-   Outpatient follow up with Orthopedics 
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
## 2022-03-25 NOTE — Evaluation (Signed)
Physical Therapy Evaluation Patient Details Name: Ashley Chambers MRN: [id number] DOB: May 12, 1930 Today's Date: 03/25/2022  History of Present Illness  88 yo female admitted 12/2 after fall at home with Lt humerus fx, UTI and fecal impaction. PMHx: HLD, Rt acetabular fx, dementia, thyroid disease  Clinical Impression  Pt pleasant, HOH and able to state need for linen change due to incontinent BM while sitting. Pt with significant balance deficits and impaired ability to power up from sitting with only single UE. Pt with decreased cognition, balance, strength and function who will benefit from acute therapy to maximize mobility and safety to decrease fall risk.        Recommendations for follow up therapy are one component of a multi-disciplinary discharge planning process, led by the attending physician.  Recommendations may be updated based on patient status, additional functional criteria and insurance authorization.  Follow Up Recommendations Skilled nursing-short term rehab (<3 hours/day) Can patient physically be transported by private vehicle: Yes    Assistance Recommended at Discharge Frequent or constant Supervision/Assistance  Patient can return home with the following  A little help with bathing/dressing/bathroom;A lot of help with bathing/dressing/bathroom;Assistance with cooking/housework;Direct supervision/assist for medications management;Assist for transportation;Direct supervision/assist for financial management    Equipment Recommendations None recommended by PT  Recommendations for Other Services  OT consult    Functional Status Assessment Patient has had a recent decline in their functional status and demonstrates the ability to make significant improvements in function in a reasonable and predictable amount of time.     Precautions / Restrictions Precautions Precautions: Fall;Other (comment) Precaution Comments: LUE sling at all times Required Braces or Orthoses:  Sling Restrictions Weight Bearing Restrictions: Yes LUE Weight Bearing: Non weight bearing      Mobility  Bed Mobility               General bed mobility comments: in chair on arrival and end of session    Transfers Overall transfer level: Needs assistance   Transfers: Sit to/from Stand Sit to Stand: Min assist           General transfer comment: min assist with cues for hand placement to stand from recliner x 2 trials with assist for pericare and linen change in standing    Ambulation/Gait Ambulation/Gait assistance: Min assist Gait Distance (Feet): 300 Feet Assistive device: IV Pole Gait Pattern/deviations: Step-through pattern, Decreased stride length, Narrow base of support   Gait velocity interpretation: <1.8 ft/sec, indicate of risk for recurrent falls   General Gait Details: pt with very narrow BOS with cues for increased stride and BOS, reliant on UE support on IV pole and min assist for balance with gait, directional cues to return to room  Stairs            Wheelchair Mobility    Modified Rankin (Stroke Patients Only)       Balance Overall balance assessment: Needs assistance   Sitting balance-Leahy Scale: Fair     Standing balance support: Single extremity supported Standing balance-Leahy Scale: Poor                               Pertinent Vitals/Pain Pain Assessment Pain Assessment: No/denies pain    Home Living Family/patient expects to be discharged to:: Private residence Living Arrangements: Other relatives Available Help at Discharge: Family;Available PRN/intermittently Type of Home: House Home Access: Stairs to enter   Entrance Stairs-Number of Steps: 1   Home  Layout: One level Home Equipment: Rollator (4 wheels);Shower seat;Cane - single point      Prior Function Prior Level of Function : Needs assist;Driving       Physical Assist : ADLs (physical)   ADLs (physical): IADLs Mobility Comments: pt  does not use AD at baseline ADLs Comments: family reports she drives about a mile, has meals on wheels, was performing bathing and dressing unassisted     Hand Dominance        Extremity/Trunk Assessment   Upper Extremity Assessment Upper Extremity Assessment: LUE deficits/detail LUE Deficits / Details: splint and sling due to fx    Lower Extremity Assessment Lower Extremity Assessment: Generalized weakness    Cervical / Trunk Assessment Cervical / Trunk Assessment: Kyphotic  Communication   Communication: HOH  Cognition Arousal/Alertness: Awake/alert Behavior During Therapy: WFL for tasks assessed/performed Overall Cognitive Status: History of cognitive impairments - at baseline                                          General Comments      Exercises     Assessment/Plan    PT Assessment Patient needs continued PT services  PT Problem List Decreased strength;Decreased mobility;Decreased safety awareness;Decreased activity tolerance;Decreased cognition;Decreased balance;Decreased knowledge of use of DME       PT Treatment Interventions Gait training;Therapeutic exercise;Patient/family education;Balance training;Functional mobility training;Therapeutic activities;Cognitive remediation;DME instruction    PT Goals (Current goals can be found in the Care Plan section)  Acute Rehab PT Goals Patient Stated Goal: return home after rehab PT Goal Formulation: With patient/family Time For Goal Achievement: 04/08/22 Potential to Achieve Goals: Fair    Frequency Min 3X/week     Co-evaluation               AM-PAC PT "6 Clicks" Mobility  Outcome Measure Help needed turning from your back to your side while in a flat bed without using bedrails?: A Little Help needed moving from lying on your back to sitting on the side of a flat bed without using bedrails?: A Lot Help needed moving to and from a bed to a chair (including a wheelchair)?: A  Little Help needed standing up from a chair using your arms (e.g., wheelchair or bedside chair)?: A Little Help needed to walk in hospital room?: A Little Help needed climbing 3-5 steps with a railing? : Total 6 Click Score: 15    End of Session Equipment Utilized During Treatment: Gait belt;Other (comment) (sling) Activity Tolerance: Patient tolerated treatment well Patient left: in chair;with call bell/phone within reach;with chair alarm set;with family/visitor present Nurse Communication: Mobility status PT Visit Diagnosis: Other abnormalities of gait and mobility (R26.89);History of falling (Z91.81)    Time: 0175-1025 PT Time Calculation (min) (ACUTE ONLY): 21 min   Charges:   PT Evaluation $PT Eval Moderate Complexity: 1 Mod          Jacqulin Brandenburger P, PT Acute Rehabilitation Services Office: [phone]   Enedina Finner Lisa-Marie Rueger 03/25/2022, 9:51 AM

## 2022-03-25 NOTE — Assessment & Plan Note (Signed)
As evidenced by moderate loss of subcutaneous muscle mass and fat diffusely 

## 2022-03-25 NOTE — Hospital Course (Signed)
Ashley Chambers is an 88 y.o. F with dementia, lives at home, hypothyroidism and peptic ulcer who presented with fall.  Patient lives with intermittent supervision from grandson and still drives.  Takes Ambien, possibly hoarding, sleepwalks at times.  On day of admission, son couldn't reach her by phone, so he called grandson to check on her who found her on the ground, injured left elbow, hallucinating and confused.    12/3: Admitted, elbow fracture, UTI, constipated 12/4: Orthopedics recommend nonoperative care for elbow, PT recommend SNF, stable for discharge 12/5: No change

## 2022-03-25 NOTE — Progress Notes (Signed)
  Progress Note   Patient: Ashley Chambers TGY:[id number] DOB: 1930/11/20 DOA: 03/24/2022     1 DOS: the patient was seen and examined on 03/25/2022 at 11:40AM      Brief hospital course: Ashley Chambers is an 88 y.o. F with dementia, lives at home, hypothyroidism and peptic ulcer who presented with fall.  Patient lives with intermittent supervision from grandson and still drives.  Takes Ambien, possibly hoarding, sleepwalks at times.  On day of admission, son couldn't reach her by phone, so he called grandson to check on her who found her on the ground, injured left elbow, hallucinating and confused.    12/3: Admitted, elbow fracture, UTI, constipated     Assessment and Plan: * Acute metabolic encephalopathy Due to ambien. - Standard delirium precautions: blinds open and lights on during day, TV off, minimize interruptions at night, glasses/hearing aids, PT/OT, avoiding [place] list medications    Malnutrition of moderate degree As evidenced by moderate loss of subcutaneous muscle mass and fat diffusely  UTI (urinary tract infection) - Continue cefadroxil - Follow urine culture  Constipation - Continue bowel regimen  Insomnia - Stop Ambien  Dementia with behavioral disturbance (HCC) - Cont  Elbow fracture, left, closed, initial encounter - Consult Ortho, they plan for nonoperative mgmt  Fall at home, initial encounter Due to Ambien - Stop Ambien  High cholesterol - Continue Lipitor  Hypothyroidism Not on LT4, TSH normal.            Subjective: Left arm hurts.  No other symptoms.     Physical Exam: BP (!) 150/66 (BP Location: Right Arm)   Pulse 78   Temp 97.6 F (36.4 C) (Oral)   Resp 18   Ht 5\' 5"  (1.651 m)   Wt 52 kg   SpO2 99%   BMI 19.08 kg/m   Thin elderly female, lying in bed, family at bedside RRR, no murmurs, no peripheral edema Respiratory rate normal, lungs clear without rales or wheezes Left arm is in a cast, right arm strength  appears normal, face symmetric, speech fluent, inattentive to conversation but oriented to family and situation.  Data Reviewed: Discussed with orthopedics CBC unremarkable Comprehensive metabolic panel normal Glucose normal TSH normal UA with bacteria CT of the head, cervical spine, chest abdomen and pelvis notable for left elbow fracture    Family Communication: Son and stepdaughter at the bedside    Disposition: Status is: Inpatient         Author: , MD 03/25/2022 6:24 PM  For on call review [email or web address].

## 2022-03-25 NOTE — Evaluation (Signed)
Occupational Therapy Evaluation Patient Details Name: Ashley Chambers MRN: [id number] DOB: 06-20-1930 Today's Date: 03/25/2022   History of Present Illness 88 yo female admitted 12/2 after fall at home with Lt humerus fx, UTI and fecal impaction. PMHx: HLD, Rt acetabular fx, dementia, thyroid disease   Clinical Impression   Pt admitted with the above diagnosis and has the deficits listed below. Pt would benefit from cont OT to increase independence with basic adls and adl transfers so she can d/c home with her grandson at some point. Pt is a fall risk and does not have supervision at home necessary to maintain her safety, therefore SNF recommended to attempt to achieve baseline functional level. Pt and family may want to consider thinking about pt living in a more supervised environment in near future due to history of dementia and recent fall.  Pt may be a candidate for ALF. Will continue to see with focus on safety during adls and fall prevention.      Recommendations for follow up therapy are one component of a multi-disciplinary discharge planning process, led by the attending physician.  Recommendations may be updated based on patient status, additional functional criteria and insurance authorization.   Follow Up Recommendations  Skilled nursing-short term rehab (<3 hours/day)     Assistance Recommended at Discharge Frequent or constant Supervision/Assistance  Patient can return home with the following A little help with walking and/or transfers;A little help with bathing/dressing/bathroom;Assistance with cooking/housework;Direct supervision/assist for medications management;Direct supervision/assist for financial management;Assist for transportation;Help with stairs or ramp for entrance    Functional Status Assessment  Patient has had a recent decline in their functional status and demonstrates the ability to make significant improvements in function in a reasonable and predictable  amount of time.  Equipment Recommendations  None recommended by OT    Recommendations for Other Services       Precautions / Restrictions Precautions Precautions: Fall;Other (comment) Precaution Comments: LUE sling at all times Required Braces or Orthoses: Sling Restrictions Weight Bearing Restrictions: Yes LUE Weight Bearing: Non weight bearing      Mobility Bed Mobility Overal bed mobility: Needs Assistance Bed Mobility: Supine to Sit     Supine to sit: Mod assist     General bed mobility comments: Pt impulsive getting up and had legs part way out of bed on arrival so sat pt up going toward weak L side therefore more assist needed. It pt gets up going to strong side, pt may require less assist. Cues needed for all mobility due to decreased awareness of deficits.    Transfers Overall transfer level: Needs assistance Equipment used: 1 person hand held assist Transfers: Sit to/from Stand, Bed to chair/wheelchair/BSC Sit to Stand: Min assist     Step pivot transfers: Min assist     General transfer comment: Pt unsteady and depenedent on outside assist.      Balance Overall balance assessment: Needs assistance Sitting-balance support: Single extremity supported, Feet supported Sitting balance-Leahy Scale: Fair     Standing balance support: Single extremity supported, During functional activity Standing balance-Leahy Scale: Poor Standing balance comment: post lean                           ADL either performed or assessed with clinical judgement   ADL Overall ADL's : Needs assistance/impaired Eating/Feeding: Set up;Sitting Eating/Feeding Details (indicate cue type and reason): assist to cut foods Grooming: Wash/dry hands;Wash/dry face;Oral care;Applying deodorant;Minimal assistance;Sitting;Cueing for compensatory techniques  Upper Body Bathing: Moderate assistance;Sitting;Cueing for compensatory techniques   Lower Body Bathing: Moderate  assistance;Sit to/from stand;Cueing for compensatory techniques   Upper Body Dressing : Maximal assistance;Sitting;Cueing for compensatory techniques   Lower Body Dressing: Maximal assistance;Sit to/from stand;Cueing for compensatory techniques   Toilet Transfer: Minimal assistance;Stand-pivot;BSC/3in1 Toilet Transfer Details (indicate cue type and reason): Pt very unsteady with shuffled steps to get to [place] Manipulation and Hygiene: Moderate assistance;Sit to/from stand;Cueing for compensatory techniques;Cueing for safety Toileting - Clothing Manipulation Details (indicate cue type and reason): Pt attempting to get up by herself, use her L arm and feels she can do these tasks alone. Pt is significant fall risk.     Functional mobility during ADLs: Minimal assistance General ADL Comments: Pt limited due to poor cognition at this time and decreased awareness that pt cannot use L arm. Pt unsteady on feet and a fall risk.     Vision Baseline Vision/History: 1 Wears glasses Ability to See in Adequate Light: 0 Adequate Patient Visual Report: No change from baseline Vision Assessment?: No apparent visual deficits Additional Comments: Pt with bruising around L eye. Pt wears glasses for distance and they are at home.  Asked family to bring them in.  Otherwise pt appears intact with vision.     Perception Perception Perception Tested?: No   Praxis      Pertinent Vitals/Pain Pain Assessment Pain Assessment: Faces Faces Pain Scale: Hurts little more Pain Location: L arm Pain Descriptors / Indicators: Grimacing, Guarding Pain Intervention(s): Limited activity within patient's tolerance, Monitored during session, Repositioned, Patient requesting pain meds-RN notified     Hand Dominance Right   Extremity/Trunk Assessment Upper Extremity Assessment Upper Extremity Assessment: LUE deficits/detail LUE Deficits / Details: splint and sling due to fx LUE: Unable to fully  assess due to pain;Unable to fully assess due to immobilization LUE Sensation: WNL LUE Coordination: decreased fine motor;decreased gross motor   Lower Extremity Assessment Lower Extremity Assessment: Generalized weakness   Cervical / Trunk Assessment Cervical / Trunk Assessment: Kyphotic   Communication Communication Communication: HOH   Cognition Arousal/Alertness: Awake/alert Behavior During Therapy: Impulsive Overall Cognitive Status: Impaired/Different from baseline Area of Impairment: Orientation, Attention, Memory, Safety/judgement, Awareness, Problem solving                 Orientation Level: Time Current Attention Level: Focused Memory: Decreased recall of precautions, Decreased short-term memory   Safety/Judgement: Decreased awareness of safety, Decreased awareness of deficits Awareness: Intellectual Problem Solving: Slow processing General Comments: Pt confused this am but started to come around once up in chair and sitting. Pt usually pays own bills, is aware of date, time and situation at baseline.     General Comments  Pt very confused on eval although improved mildly with time. Feel pt is a fall risk at home with grandson who is not there all the time and only provides PRN assist.  Feel pt may need SNF at d/c.    Exercises     Shoulder Instructions      Home Living Family/patient expects to be discharged to:: Private residence Living Arrangements: Other relatives Available Help at Discharge: Family;Available PRN/intermittently Type of Home: House Home Access: Stairs to enter [employer] of Steps: 1   Home Layout: One level     Bathroom Shower/Tub: Teacher, early years/pre: Standard     Home Equipment: Rollator (4 wheels);Shower seat;Cane - single point   Additional Comments: Pt lives in one level home with grandson who  cannot assist pt more than distant supervision.  Family feels pt requires more supervision.  Feel SNF may  be best option.      Prior Functioning/Environment Prior Level of Function : Needs assist;Driving       Physical Assist : ADLs (physical)   ADLs (physical): IADLs Mobility Comments: pt does not use AD at baseline ADLs Comments: family reports she drives about a mile, has meals on wheels, was performing bathing and dressing unassisted        OT Problem List: Decreased strength;Decreased range of motion;Impaired balance (sitting and/or standing);Decreased coordination;Decreased cognition;Decreased safety awareness;Decreased knowledge of use of DME or AE;Decreased knowledge of precautions;Impaired UE functional use;Pain      OT Treatment/Interventions: Self-care/ADL training;Therapeutic activities;DME and/or AE instruction;Balance training    OT Goals(Current goals can be found in the care plan section) Acute Rehab OT Goals Patient Stated Goal: to go home OT Goal Formulation: With patient/family Time For Goal Achievement: 04/08/22 Potential to Achieve Goals: Good ADL Goals Pt Will Perform Grooming: with min guard assist;standing Pt Will Perform Upper Body Dressing: with min assist;sitting Pt Will Perform Lower Body Dressing: with min assist Pt Will Transfer to Toilet: with supervision;ambulating;regular height toilet;bedside commode Pt Will Perform Toileting - Clothing Manipulation and hygiene: with supervision;sitting/lateral leans Additional ADL Goal #1: Pt will state and explain NWB precautions of LUE and the need for them.  OT Frequency: Min 2X/week    Co-evaluation              AM-PAC OT "6 Clicks" Daily Activity     Outcome Measure Help from another person eating meals?: A Little Help from another person taking care of personal grooming?: A Little Help from another person toileting, which includes using toliet, bedpan, or urinal?: A Lot Help from another person bathing (including washing, rinsing, drying)?: A Lot Help from another person to put on and taking off  regular upper body clothing?: A Lot Help from another person to put on and taking off regular lower body clothing?: A Lot 6 Click Score: 14   End of Session Nurse Communication: Mobility status  Activity Tolerance: Patient tolerated treatment well Patient left: in chair;with call bell/phone within reach;with chair alarm set;with family/visitor present  OT Visit Diagnosis: Unsteadiness on feet (R26.81)                Time: [id number] OT Time Calculation (min): 32 min Charges:  OT General Charges $OT Visit: 1 Visit OT Evaluation $OT Eval Moderate Complexity: 1 Mod OT Treatments $Self Care/Home Management : 8-22 mins  Glenford Peers 03/25/2022, 9:57 AM

## 2022-03-25 NOTE — Assessment & Plan Note (Signed)
Due to [facility] - Continue cefadroxil day 3 of 5

## 2022-03-25 NOTE — TOC Progression Note (Addendum)
Transition of Care [facility]) - Progression Note    Patient Details  Name: Ashley Chambers MRN: [id number] Date of Birth: 1930-06-30  Transition of [facility]) CM/SW Contact  Carmina Miller, LCSWA Phone Number: 03/25/2022, 3:35 PM  Clinical Narrative:     [facility] initiated by CSW with a start date on 03/26/22-SNF TBD and updated after choice made. Auth reference number [id number]. Marland Kitchen   Expected Discharge Plan: Skilled Nursing Facility Barriers to Discharge: Continued Medical Work up  Expected Discharge Plan and Services Expected Discharge Plan: Skilled Nursing Facility In-house Referral: Clinical Social Work     Living arrangements for the past 2 months: Independent Living Facility                                       Social Determinants of Health (SDOH) Interventions Housing Interventions: Intervention Not Indicated  Readmission Risk Interventions     No data to display

## 2022-03-25 NOTE — Assessment & Plan Note (Signed)
-  Continue Lipitor °

## 2022-03-25 NOTE — Assessment & Plan Note (Signed)
Cont.. 

## 2022-03-25 NOTE — Assessment & Plan Note (Signed)
-   Stop Ambien

## 2022-03-26 DIAGNOSIS — G9341 Metabolic encephalopathy: Secondary | ICD-10-CM | POA: Diagnosis not present

## 2022-03-26 LAB — URINE CULTURE: Culture: >=100000 — AB
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
## 2022-03-26 NOTE — Progress Notes (Signed)
Mobility Specialist Progress Note:   03/26/22 1600  Mobility  Activity Ambulated with assistance in hallway  Level of Assistance Minimal assist, patient does 75% or more  Assistive Device Other (Comment) (IV Pole)  Distance Ambulated (ft) 150 ft  LUE Weight Bearing NWB  Activity Response Tolerated well  Mobility Referral Yes  $Mobility charge 1 Mobility   Pt agreeable to mobility session. Required minA to get to EOB, minA to stand and steadying assist during ambulation. Pt left in chair with all needs met, chair alarm on.  Nelta Numbers Mobility Specialist Please contact via SecureChat or  Rehab office at [phone]

## 2022-03-26 NOTE — Progress Notes (Signed)
  Progress Note   Patient: Ashley Chambers [id number] DOB: 1930-10-09 DOA: 03/24/2022     2 DOS: the patient was seen and examined on 03/26/2022 at 8:45AM      Brief hospital course: Mrs. Ord is an 88 y.o. F with dementia, lives at home, hypothyroidism and peptic ulcer who presented with fall.  Patient lives with intermittent supervision from grandson and still drives.  Takes Ambien, possibly hoarding, sleepwalks at times.  On day of admission, son couldn't reach her by phone, so he called grandson to check on her who found her on the ground, injured left elbow, hallucinating and confused.    12/3: Admitted, elbow fracture, UTI, constipated 12/4: Orthopedics recommend nonoperative care for elbow, PT recommend SNF, stable for discharge 12/5: No change     Assessment and Plan: * Acute metabolic encephalopathy Due to ambien. - Standard delirium precautions: blinds open and lights on during day, TV off, minimize interruptions at night, glasses/hearing aids, PT/OT, avoiding Beers list medications    Malnutrition of moderate degree As evidenced by moderate loss of subcutaneous muscle mass and fat diffusely  UTI (urinary tract infection) Due to Ecoli - Continue cefadroxil day 3 of 5  Constipation - Continue bowel regimen  Insomnia - Stop Ambien  Dementia with behavioral disturbance ([facility]) - Cont  Elbow fracture, left, closed, initial encounter - Outpatient follow up with Orthopedics  Fall at home, initial encounter Due to Ambien - Stop Ambien  High cholesterol - Continue Lipitor  Hypothyroidism Not on LT4, TSH normal.            Subjective: No new complaints.     Physical Exam: BP (!) 158/68 (BP Location: Right Arm)   Pulse 73   Temp 98 F (36.7 C) (Oral)   Resp 15   Ht 5\' 5"  (1.651 m)   Wt 52 kg   SpO2 97%   BMI 19.08 kg/m   Thin elderly female, eating breakfast, talking with family, arm in sling RRR, no murmurs, no LE  edema Respiratory rate normal, lungs clear without rales or wheezes Left arm in cast, right arm strength appears normal, face symmetric, speech fluent, pleasant and interactive    Data Reviewed: No new labs  Family Communication: Son at the bedside    Disposition: Status is: Inpatient Patient was admitted for a fall in the setting of Ambien use, constipation, dehydration  Her encephalopathy is clearly resolved, orthopedics treatment plan is clear, and she is stable for discharge to SNF whenever bed is available        Author: , MD 03/26/2022 5:28 PM  For on call review [email or web address].

## 2022-03-26 NOTE — Progress Notes (Addendum)
2:30pm: CSW received return call from patient's son Viviann Spare who states he wants his mother placed at [facility] in [place]. Viviann Spare reports he spoke with Maurine Minister in admissions at the facility who states there are beds avaliable.  CSW faxed clinical information to [phone].  CSW attempted to reach [place] at [facility] without success - a voicemail was left requesting a return call.  11:40am: CSW spoke with patient's son Viviann Spare to present him with bed offers from [facility] and [facility]. Viviann Spare requested time to make a decision and will return call to CSW before 2pm with a facility choice.  Edwin Dada, MSW, LCSW Transitions of Care  Clinical Social Worker II [phone]

## 2022-03-27 ENCOUNTER — Encounter (HOSPITAL_COMMUNITY): Payer: Self-pay | Admitting: Internal Medicine

## 2022-03-27 DIAGNOSIS — G9341 Metabolic encephalopathy: Secondary | ICD-10-CM | POA: Diagnosis not present

## 2022-03-27 NOTE — Progress Notes (Signed)
Physical Therapy Treatment Patient Details Name: Ashley Chambers MRN: [id number] DOB: 1931/03/07 Today's Date: 03/27/2022   History of Present Illness 88 yo female admitted 12/2 after fall at home with Lt humerus fx, UTI and fecal impaction. PMHx: HLD, Rt acetabular fx, dementia, thyroid disease    PT Comments    Pt alert and agreeable for treatment session. Mentioned having the urge to use the bathroom to void urine but was urge went away once standing. ambulated >141f with hand held assist and 150 ft using SPC with contact guard assist. Shows decreased step length, foot elevation, and narrow base of support. Required intermittent cuing for maintaining step length, foot clearance and gait navigation.  Pt returned to room with son present, with no urge to void. Seated upright in recliner with alarm on, call/bell close by and all needs met.   Recommendations for follow up therapy are one component of a multi-disciplinary discharge planning process, led by the attending physician.  Recommendations may be updated based on patient status, additional functional criteria and insurance authorization.  Follow Up Recommendations  Skilled nursing-short term rehab (<3 hours/day) Can patient physically be transported by private vehicle: Yes   Assistance Recommended at Discharge Frequent or constant Supervision/Assistance  Patient can return home with the following A little help with bathing/dressing/bathroom;A lot of help with bathing/dressing/bathroom;Assistance with cooking/housework;Direct supervision/assist for medications management;Assist for transportation;Direct supervision/assist for financial management   Equipment Recommendations  Cane    Recommendations for Other Services OT consult     Precautions / Restrictions Precautions Precautions: Fall Required Braces or Orthoses: Sling Restrictions Weight Bearing Restrictions: Yes LUE Weight Bearing: Non weight bearing     Mobility  Bed  Mobility Overal bed mobility: Needs Assistance Bed Mobility: Supine to Sit     Supine to sit: Min assist     General bed mobility comments: semi relcined in bed on arrival, in recliner with legs extended end of session    Transfers Overall transfer level: Needs assistance Equipment used: 1 person hand held assist Transfers: Sit to/from Stand Sit to Stand: Min assist           General transfer comment: Min assist with cues for hand placement to stand from EOB and chair    Ambulation/Gait Ambulation/Gait assistance: Min guard, Supervision (Transitioned from min guard to supervsion intermittently) Gait Distance (Feet): 300 Feet Assistive device: 1 person hand held assist, Straight cane Gait Pattern/deviations: Narrow base of support, Staggering right, Decreased step length - left, Decreased stride length, Step-through pattern   Gait velocity interpretation: <1.8 ft/sec, indicate of risk for recurrent falls   General Gait Details: Pt with narrow BOS with staggering to right. Cues for increases step length and knee elvation. Ambulated with minA to supervison using cane   Stairs             Wheelchair Mobility    Modified Rankin (Stroke Patients Only)       Balance Overall balance assessment: Needs assistance Sitting-balance support: Single extremity supported Sitting balance-Leahy Scale: Fair     Standing balance support: Single extremity supported Standing balance-Leahy Scale: Poor                              Cognition Arousal/Alertness: Awake/alert Behavior During Therapy: WFL for tasks assessed/performed Overall Cognitive Status: History of cognitive impairments - at baseline Area of Impairment: Orientation, Safety/judgement, Problem solving, Awareness  Orientation Level: Time Current Attention Level: Focused Memory: Decreased recall of precautions   Safety/Judgement: Decreased awareness of safety Awareness:  Intellectual Problem Solving: Slow processing General Comments: Pt alert and agreeable for treatment session, comments that she would walk better with her shoes on. Pt is aware od date, time and situation at [place]        Exercises      General Comments General comments (skin integrity, edema, etc.): Pt able to maintain balance with hand held assit and with cane with contact gaurd assist      Pertinent Vitals/Pain Pain Assessment Pain Assessment: Faces (Rrespond with discomfort if arm is not in ideal position) Faces Pain Scale: Hurts a little bit Pain Location: L arm with movement Pain Descriptors / Indicators: Aching, Grimacing Pain Intervention(s): Limited activity within patient's tolerance, Repositioned (Increase discomfort in L arm if sling not in deal position)    Home Living                          Prior Function            PT Goals (current goals can now be found in the care plan section) Acute Rehab PT Goals Patient Stated Goal: return home after rehab PT Goal Formulation: With patient Time For Goal Achievement: 04/10/22 Potential to Achieve Goals: Fair Progress towards PT goals: Goals met and updated - see care plan    Frequency    Min 3X/week      PT Plan Current plan remains appropriate    Co-evaluation              AM-PAC PT "6 Clicks" Mobility   Outcome Measure                   End of Session Equipment Utilized During Treatment: Gait belt Activity Tolerance: Patient tolerated treatment well Patient left: in chair;with call bell/phone within reach;with chair alarm set Nurse Communication: Mobility status PT Visit Diagnosis: Other abnormalities of gait and mobility (R26.89);History of falling (Z91.81)     Time: 3419-6222 PT Time Calculation (min) (ACUTE ONLY): 25 min  Charges:  $Gait Training: 23-37 mins                     Ashley Chambers PT, SPT   Ashley Chambers 03/27/2022, 4:53 PM

## 2022-03-27 NOTE — Care Management Important Message (Signed)
Important Message  Patient Details  Name: Ashley Chambers MRN: [id number] Date of Birth: 1930/11/19   Medicare Important Message Given:  Yes     Mardene Sayer 03/27/2022, 4:44 PM

## 2022-03-27 NOTE — Progress Notes (Signed)
  Progress Note   Patient: Ashley Chambers [id number] DOB: 05/26/1930 DOA: 03/24/2022     3 DOS: the patient was seen and examined on 03/27/2022 at 8:45AM      Brief hospital course: [name] is an 88 y.o. F with dementia, lives at home, hypothyroidism and peptic ulcer who presented with fall.  Patient lives with intermittent supervision from grandson and still drives.  Takes Ambien, possibly hoarding, sleepwalks at times.  On day of admission, son couldn't reach her by phone, so he called grandson to check on her who found her on the ground, injured left elbow, hallucinating and confused.    12/3: Admitted, elbow fracture, UTI, constipated 12/4: Orthopedics recommend nonoperative care for elbow, PT recommend SNF, stable for discharge 12/5: No change 12/6: Still no bed offers yet.  Medically stable  Assessment and Plan: * Acute metabolic encephalopathy Due to ambien. - Standard delirium precautions: blinds open and lights on during day, TV off, minimize interruptions at night, glasses/hearing aids, PT/OT, avoiding Beers list medications    Malnutrition of moderate degree As evidenced by moderate loss of subcutaneous muscle mass and fat diffusely  UTI (urinary tract infection) Due to Ecoli - Continue cefadroxil day 4 of 5  Constipation - Continue bowel regimen  Insomnia - Stop Ambien  Dementia with behavioral disturbance ([facility]CC) - Cont  Elbow fracture, left, closed, initial encounter - Outpatient follow up with Orthopedics  Fall at home, initial encounter Due to Ambien - Stop Ambien  High cholesterol - Continue Lipitor  Hypothyroidism Not on LT4, TSH normal.       Subjective: Patient was seen and examined today.  No new complaint..   Physical Exam: BP (!) 117/55 (BP Location: Right Arm)   Pulse 60   Temp 98.7 F (37.1 C) (Oral)   Resp 17   Ht 5\' 5"  (1.651 m)   Wt 52 kg   SpO2 95%   BMI 19.08 kg/m   General.  Frail elderly lady, in no acute  distress.  Left eye with surrounding ecchymosis Pulmonary.  Lungs clear bilaterally, normal respiratory effort. CV.  Regular rate and rhythm, no JVD, rub or murmur. Abdomen.  Soft, nontender, nondistended, BS positive. CNS.  Alert and oriented .  No focal neurologic deficit. Extremities.  No edema, no cyanosis, pulses intact and symmetrical.  LUE with sling, Psychiatry.  Judgment and insight appears normal.   Data Reviewed: No new labs  Family Communication:   Disposition: SNF Status is: Inpatient Patient was admitted for a fall in the setting of Ambien use, constipation, dehydration  Her encephalopathy is clearly resolved, orthopedics treatment plan is clear, and she is stable for discharge to SNF whenever bed is available  DVT prophylaxis.  Lovenox  This record has been created using . Errors have been sought and corrected,but may not always be located. Such creation errors do not reflect on the standard of care.   Author: Conservation officer, historic buildings, MD 03/27/2022 3:38 PM  For on call review [email or web address].

## 2022-03-27 NOTE — Progress Notes (Addendum)
1:50pm: CSW spoke with [place] at [facility] who states there are no beds available at this time.  CSW received return call from [facility] in admissions [facility] who states she will review clinicals to determine if a bed offer can be made.  1:10pm: CSW received additional call from patient's son requesting [facility] staff speak with himself and patient at bedside - CSW notified Marcelino Duster, RN CM of request.  12:05pm: CSW spoke with patient's son Viviann Spare who states he is en route to the hospital now to present his mother with the option of going to [facility]. Viviann Spare will return call to CSW once a final decision is made.  8:40am: CSW spoke with patient's son to inform him that the requested facility ([facility]) is not in network with [employer] plan and that he would have to pay privately. Viviann Spare states he is going to contact the facility to discuss prices and will return call CSW. Viviann Spare is not agreeable to accept any other bed offers at this time. CSW and Viviann Spare discussed the possibility of patient returning home with home health if SNF is not an option.  Edwin Dada, MSW, LCSW Transitions of Care  Clinical Social Worker II [phone]

## 2022-03-27 NOTE — Plan of Care (Signed)
  Problem: Acute Rehab PT Goals(only PT should resolve) Goal: Pt Will Go Supine/Side To Sit Flowsheets (Taken 03/27/2022 1641) Pt will go Supine/Side to Sit: with supervision Goal: Patient Will Transfer Sit To/From Stand 03/27/2022 1643 by Vena Rua, Student-PT Flowsheets (Taken 03/27/2022 1643) Patient will transfer sit to/from stand: with supervision 03/27/2022 1641 by Vena Rua, Student-PT Flowsheets (Taken 03/27/2022 1641) Patient will transfer sit to/from stand: with supervision Goal: Pt Will Transfer Bed To Chair/Chair To Bed Flowsheets (Taken 03/27/2022 1641) Pt will Transfer Bed to Chair/Chair to Bed: min guard assist Goal: Pt Will Ambulate Flowsheets (Taken 03/27/2022 1641) Pt will Ambulate:  with supervision  > 125 feet  with least restrictive assistive device  Goals updated due to progress  Vena Rua PT, SPT

## 2022-03-27 NOTE — Progress Notes (Signed)
Mobility Specialist Progress Note:   03/27/22 1056  Mobility  Activity Ambulated with assistance in hallway  Level of Assistance Minimal assist, patient does 75% or more  Assistive Device Other (Comment) (IV Pole)  Distance Ambulated (ft) 150 ft  LUE Weight Bearing NWB  Activity Response Tolerated well  Mobility Referral Yes  $Mobility charge 1 Mobility   Pt received in bed and agreeable. No complaints. Pt left in chair with all needs met, call bell in reach, and chair alarm on.     Mobility Specialist Please contact via SecureChat or  Rehab office at [phone]  

## 2022-03-28 DIAGNOSIS — S42472D Displaced transcondylar fracture of left humerus, subsequent encounter for fracture with routine healing: Secondary | ICD-10-CM | POA: Diagnosis not present

## 2022-03-28 DIAGNOSIS — Z79899 Other long term (current) drug therapy: Secondary | ICD-10-CM | POA: Diagnosis not present

## 2022-03-28 DIAGNOSIS — N390 Urinary tract infection, site not specified: Secondary | ICD-10-CM | POA: Diagnosis not present

## 2022-03-28 DIAGNOSIS — S2249XA Multiple fractures of ribs, unspecified side, initial encounter for closed fracture: Secondary | ICD-10-CM | POA: Diagnosis not present

## 2022-03-28 DIAGNOSIS — R531 Weakness: Secondary | ICD-10-CM | POA: Diagnosis not present

## 2022-03-28 DIAGNOSIS — G4700 Insomnia, unspecified: Secondary | ICD-10-CM | POA: Diagnosis not present

## 2022-03-28 DIAGNOSIS — E039 Hypothyroidism, unspecified: Secondary | ICD-10-CM | POA: Diagnosis not present

## 2022-03-28 DIAGNOSIS — E119 Type 2 diabetes mellitus without complications: Secondary | ICD-10-CM | POA: Diagnosis not present

## 2022-03-28 DIAGNOSIS — Z4789 Encounter for other orthopedic aftercare: Secondary | ICD-10-CM | POA: Diagnosis not present

## 2022-03-28 DIAGNOSIS — S42412D Displaced simple supracondylar fracture without intercondylar fracture of left humerus, subsequent encounter for fracture with routine healing: Secondary | ICD-10-CM | POA: Diagnosis not present

## 2022-03-28 DIAGNOSIS — G9341 Metabolic encephalopathy: Secondary | ICD-10-CM | POA: Diagnosis not present

## 2022-03-28 DIAGNOSIS — E440 Moderate protein-calorie malnutrition: Secondary | ICD-10-CM | POA: Diagnosis not present

## 2022-03-28 DIAGNOSIS — S42402A Unspecified fracture of lower end of left humerus, initial encounter for closed fracture: Secondary | ICD-10-CM | POA: Diagnosis not present

## 2022-03-28 DIAGNOSIS — M25512 Pain in left shoulder: Secondary | ICD-10-CM | POA: Diagnosis not present

## 2022-03-28 DIAGNOSIS — R4182 Altered mental status, unspecified: Secondary | ICD-10-CM | POA: Diagnosis not present

## 2022-03-28 DIAGNOSIS — R079 Chest pain, unspecified: Secondary | ICD-10-CM | POA: Diagnosis not present

## 2022-03-28 DIAGNOSIS — D649 Anemia, unspecified: Secondary | ICD-10-CM | POA: Diagnosis not present

## 2022-03-28 DIAGNOSIS — Z7401 Bed confinement status: Secondary | ICD-10-CM | POA: Diagnosis not present

## 2022-03-28 DIAGNOSIS — F03918 Unspecified dementia, unspecified severity, with other behavioral disturbance: Secondary | ICD-10-CM | POA: Diagnosis not present

## 2022-03-28 MED ORDER — TRAMADOL HCL 50 MG PO TABS: 50.0000 mg | ORAL_TABLET | Freq: Four times a day (QID) | ORAL | 0 refills | Status: AC | PRN

## 2022-03-28 MED ORDER — TRAZODONE HCL 50 MG PO TABS: 50.0000 mg | ORAL_TABLET | Freq: Every evening | ORAL | Status: AC | PRN

## 2022-03-28 MED ORDER — ALENDRONATE SODIUM 70 MG PO TABS: 70.0000 mg | ORAL_TABLET | ORAL | Status: AC

## 2022-03-28 MED ORDER — POLYETHYLENE GLYCOL 3350 17 G PO PACK: 17.0000 g | PACK | Freq: Two times a day (BID) | ORAL | 0 refills | Status: AC

## 2022-03-28 MED ORDER — OXYCODONE HCL 5 MG PO TABS: 5.0000 mg | ORAL_TABLET | Freq: Three times a day (TID) | ORAL | 0 refills | Status: AC | PRN

## 2022-03-28 MED ORDER — BISACODYL 5 MG PO TBEC: 5.0000 mg | DELAYED_RELEASE_TABLET | Freq: Every day | ORAL | 0 refills | Status: AC | PRN

## 2022-03-28 NOTE — Plan of Care (Signed)

## 2022-03-28 NOTE — Discharge Summary (Signed)
Ashley Chambers ZOX:[id number] DOB: [birth year] DOA: 03/24/2022  PCP: Tally Joe, MD  Admit date: 03/24/2022  Discharge date: 03/28/2022  Admitted From: Home   Disposition:  SNF   Recommendations for Outpatient Follow-up:   Follow up with PCP in 1-2 weeks  PCP Please obtain BMP/CBC, 2 view CXR in 1week,  (see Discharge instructions)   PCP Please follow up on the following pending results:    Home Health: None   Equipment/Devices: None  Consultations: Ortho Discharge Condition: Stable    CODE STATUS: DNR   Diet Recommendation: Heart Healthy      Chief Complaint  Patient presents with   Fall     Brief history of present illness from the day of admission and additional interim summary    88 y.o. F with dementia, lives at home, hypothyroidism and peptic ulcer who presented with fall.   Patient lives with intermittent supervision from grandson and still drives.  Takes Ambien, possibly hoarding, sleepwalks at times.   On day of admission, son couldn't reach her by phone, so he called grandson to check on her who found her on the ground, injured left elbow, hallucinating and confused.       12/3: Admitted, elbow fracture, UTI, constipated 12/4: Orthopedics recommend nonoperative care for elbow, PT recommend SNF, stable for discharge 12/5: No change 12/6: Still no bed offers yet.  Medically stable                                                                 Hospital Course   Acute metabolic encephalopathy  Due to [facility] which was stopped, mentation much improved, CT head nonacute, no focal deficits, no headache, continue observing Standard delirium precautions: blinds open and lights on during day, TV off, minimize interruptions at night, glasses/hearing aids, PT/OT, encouraged to sit up in chair in  daytime.  Will require SNF.   Malnutrition of moderate degree  As evidenced by moderate loss of subcutaneous muscle mass and fat diffusely, protein supplementation per SNF policy.   UTI (urinary tract infection)  Due to [facility], finished cephalosporin treatment today.   Constipation - Continue bowel regimen   Insomnia  - Stop Ambien, as needed trazodone   Dementia with behavioral disturbance (HCC) -minimal, remains at risk for delirium, currently close to baseline.   Elbow fracture, left, closed, initial encounter  - Outpatient follow up with Orthopedics, DC instructions for left arm care in the discharge instruction section.  Must follow-up with orthopedics in 2 to 3 weeks.  No weightbearing in the left arm, will require SNF.   Fall at home, initial encounter  Due to Ambien, Stopped Ambien   High cholesterol  - Continue Lipitor   Hypothyroidism - Not on LT4, TSH normal.      Discharge diagnosis  Principal Problem:   Acute metabolic encephalopathy Active Problems:   Hypothyroidism   High cholesterol   Fall at home, initial encounter   Elbow fracture, left, closed, initial encounter   Dementia with behavioral disturbance (HCC)   Insomnia   Constipation   UTI (urinary tract infection)   Malnutrition of moderate degree    Discharge instructions    Discharge Instructions     Discharge instructions   Complete by: As directed    Low T-type both column L distal humerus fracture                NWB L UEx                         No lifting with left arm                plan for non-op treatment               Splint for 2-3 weeks then convert to fracture/ROM brace               Sling on at all times                Ok to move digits    Follow with Primary MD Tally Joe, MD in 7 days   Get CBC, BMP, Magnesium -  checked next visit with your primary MD or SNF MD   Activity: As tolerated with Full fall precautions use walker/cane & assistance as needed  Disposition  SNF  Diet: Heart Healthy   Special Instructions: If you have smoked or chewed Tobacco  in the last 2 yrs please stop smoking, stop any regular Alcohol  and or any Recreational drug use.  On your next visit with your primary care physician please Get Medicines reviewed and adjusted.  Please request your Prim.MD to go over all Hospital Tests and Procedure/Radiological results at the follow up, please get all Hospital records sent to your Prim MD by signing hospital release before you go home.  If you experience worsening of your admission symptoms, develop shortness of breath, life threatening emergency, suicidal or homicidal thoughts you must seek medical attention immediately by calling 911 or calling your MD immediately  if symptoms less severe.  You Must read complete instructions/literature along with all the possible adverse reactions/side effects for all the Medicines you take and that have been prescribed to you. Take any new Medicines after you have completely understood and accpet all the possible adverse reactions/side effects.   Increase activity slowly   Complete by: As directed        Discharge Medications   Allergies as of 03/28/2022       Reactions   Nsaids Other (See Comments)   Gi bleeding   Tolmetin Other (See Comments)   Gi bleeding   Sulfa Antibiotics Other (See Comments)   Unknown        Medication List     STOP taking these medications    zolpidem 5 MG tablet Commonly known as: AMBIEN       TAKE these medications    acetaminophen 325 MG tablet Commonly known as: TYLENOL Take 2 tablets (650 mg total) by mouth every 6 (six) hours.   alendronate 70 MG tablet Commonly known as: FOSAMAX Take 1 tablet (70 mg total) by mouth every Sunday. Take with a full glass of water on an empty stomach. Start taking on: April 27, 2022 What changed: These instructions  start on April 27, 2022. If you are unsure what to do until then, ask your doctor or other  care provider.   atorvastatin 10 MG tablet Commonly known as: LIPITOR Take 10 mg by mouth daily.   bisacodyl 5 MG EC tablet Commonly known as: DULCOLAX Take 1 tablet (5 mg total) by mouth daily as needed for moderate constipation.   CLEAR EYES OP Place 2 drops into both eyes daily as needed (For dry eyes.).   docusate sodium 100 MG capsule Commonly known as: COLACE Take 1 capsule (100 mg total) by mouth 2 (two) times daily.   methocarbamol 500 MG tablet Commonly known as: ROBAXIN Take 1 tablet (500 mg total) by mouth every 8 (eight) hours as needed for muscle spasms.   multivitamin with minerals Tabs tablet Take 1 tablet by mouth every morning.   ondansetron 4 MG disintegrating tablet Commonly known as: ZOFRAN-ODT Take 1 tablet (4 mg total) by mouth every 6 (six) hours as needed for nausea.   oxyCODONE 5 MG immediate release tablet Commonly known as: Oxy IR/ROXICODONE Take 1 tablet (5 mg total) by mouth every 8 (eight) hours as needed for breakthrough pain or severe pain.   polyethylene glycol 17 g packet Commonly known as: MIRALAX / GLYCOLAX Take 17 g by mouth 2 (two) times daily.   traMADol 50 MG tablet Commonly known as: ULTRAM Take 1 tablet (50 mg total) by mouth every 6 (six) hours as needed for severe pain or moderate pain. What changed:  when to take this reasons to take this   traZODone 50 MG tablet Commonly known as: DESYREL Take 1 tablet (50 mg total) by mouth at bedtime as needed for sleep.   Vitamin D3 10 MCG (400 UNIT) Caps Take 400 Units by mouth every morning.         Contact information for follow-up providers     Tally Joe, MD. Schedule an appointment as soon as possible for a visit in 1 week(s).   Specialty: Family Medicine Contact information: [phone] [address] [place] [address] [phone]         Montez Morita, PA-C. Schedule an appointment as soon as possible for a visit in 1 week(s).   Specialty: Orthopedic  Surgery Contact information: [address] [place] [address] [phone]              Contact information for after-discharge care     Destination     [facility] .   Service: Skilled Nursing Contact information: [address] [place] [address] [phone]                     Major procedures and Radiology Reports - PLEASE review detailed and final reports thoroughly  -     CT 3D RECON AT SCANNER  Result Date: 03/24/2022 CLINICAL DATA:  Nonspecific (abnormal) findings on radiological and other examination of musculoskeletal system left elbow fracture. EXAM: 3-DIMENSIONAL CT IMAGE RENDERING ON ACQUISITION WORKSTATION TECHNIQUE: 3-dimensional CT images were rendered by post-processing of the original CT data on an acquisition workstation. The 3-dimensional CT images were interpreted and findings were reported in the accompanying complete CT report for this study COMPARISON:  Same day x-rays FINDINGS: 3D reformatted images of the elbow were obtained demonstrating distal humeral fracture. IMPRESSION: 3D reformatted images of the elbow demonstrating distal humeral fracture. Electronically Signed   By: Duanne Guess D.O.   On: 03/24/2022 17:51   CT ELBOW  LEFT WO CONTRAST  Result Date: 03/24/2022 CLINICAL DATA:  Distal humerus fracture EXAM: CT OF THE UPPER LEFT EXTREMITY WITHOUT CONTRAST TECHNIQUE: Multidetector CT imaging of the upper left extremity was performed according to the standard protocol. RADIATION DOSE REDUCTION: This exam was performed according to the departmental dose-optimization program which includes automated exposure control, adjustment of the mA and/or kV according to patient size and/or use of iterative reconstruction technique. COMPARISON:  Same-day x-ray FINDINGS: Bones/Joint/Cartilage Acute transversely oriented fracture through the supracondylar aspect of the distal left humerus. There is intra-articular  extension through the trochlea (series 6, image 26). 6 mm of medial displacement of the medial epicondylar fracture component. No significant angulation. The proximal radius and ulna are intact without fracture. No dislocation. Large elbow joint hemarthrosis. Ligaments Suboptimally assessed by CT. Muscles and Tendons Enlargement of the flexor compartment musculature of the proximal forearm, likely component of intramuscular hematoma. Soft tissues Extensive soft tissue swelling and ill-defined hematoma about the elbow, which is most pronounced medially. IMPRESSION: 1. Acute mildly displaced intra-articular fracture of the distal left humerus, as described. 2. Large elbow joint hemarthrosis. 3. Enlargement of the flexor compartment musculature of the proximal forearm, likely component of intramuscular hematoma. 4. Extensive soft tissue swelling and ill-defined hematoma about the elbow, which is most pronounced medially. Electronically Signed   By: Duanne Guess D.O.   On: 03/24/2022 17:02   CT HEAD WO CONTRAST ( )  Result Date: 03/24/2022 CLINICAL DATA:  88 year old female with head, neck and face pain following fall. EXAM: CT HEAD WITHOUT CONTRAST CT MAXILLOFACIAL WITHOUT CONTRAST CT CERVICAL SPINE WITHOUT CONTRAST TECHNIQUE: Multidetector CT imaging of the head, cervical spine, and maxillofacial structures were performed using the standard protocol without intravenous contrast. Multiplanar CT image reconstructions of the cervical spine and maxillofacial structures were also generated. RADIATION DOSE REDUCTION: This exam was performed according to the departmental dose-optimization program which includes automated exposure control, adjustment of the mA and/or kV according to patient size and/or use of iterative reconstruction technique. COMPARISON:  11/11/2017 CT FINDINGS: CT HEAD FINDINGS Brain: No evidence of acute infarction, hemorrhage, hydrocephalus, extra-axial collection or mass lesion/mass effect.  Atrophy, remote LEFT basal ganglia/internal capsule infarct and chronic small-vessel white matter ischemic changes again noted. Vascular: Carotid and vertebral atherosclerotic calcifications are noted. Skull: Normal. Negative for fracture or focal lesion. Other: None. CT MAXILLOFACIAL FINDINGS Osseous: No fracture or mandibular dislocation. No destructive process. Orbits: Negative. No traumatic or inflammatory finding. Sinuses: Mild frothy material within several paranasal sinuses again noted. Nasal septal deviation to the RIGHT is unchanged. Soft tissues: Negative. CT CERVICAL SPINE FINDINGS Alignment: Normal. Skull base and vertebrae: No acute fracture. No primary bone lesion or focal pathologic process. Soft tissues and spinal canal: No prevertebral fluid or swelling. No visible canal hematoma. Disc levels: Mild to moderate degenerative disc disease/spondylosis noted, greatest at C4-5 and C5-6 contributing to mild central spinal and bony foraminal narrowing at these levels. Upper chest: No acute abnormality Other: None IMPRESSION: 1. No evidence of acute intracranial abnormality. Atrophy, remote LEFT basal ganglia/internal capsule infarct and chronic small-vessel white matter ischemic changes. 2. No evidence of acute facial fracture. 3. No evidence of acute injury to the cervical spine. Mild to moderate degenerative disc disease/spondylosis, greatest at C4-5 and C5-6 contributing to mild central spinal and bony foraminal narrowing at these levels. Electronically Signed   By: Harmon Pier M.D.   On: 03/24/2022 15:13   CT Cervical Spine Wo Contrast  Result Date: 03/24/2022 CLINICAL DATA:  88 year old  female with head, neck and face pain following fall. EXAM: CT HEAD WITHOUT CONTRAST CT MAXILLOFACIAL WITHOUT CONTRAST CT CERVICAL SPINE WITHOUT CONTRAST TECHNIQUE: Multidetector CT imaging of the head, cervical spine, and maxillofacial structures were performed using the standard protocol without intravenous  contrast. Multiplanar CT image reconstructions of the cervical spine and maxillofacial structures were also generated. RADIATION DOSE REDUCTION: This exam was performed according to the departmental dose-optimization program which includes automated exposure control, adjustment of the mA and/or kV according to patient size and/or use of iterative reconstruction technique. COMPARISON:  11/11/2017 CT FINDINGS: CT HEAD FINDINGS Brain: No evidence of acute infarction, hemorrhage, hydrocephalus, extra-axial collection or mass lesion/mass effect. Atrophy, remote LEFT basal ganglia/internal capsule infarct and chronic small-vessel white matter ischemic changes again noted. Vascular: Carotid and vertebral atherosclerotic calcifications are noted. Skull: Normal. Negative for fracture or focal lesion. Other: None. CT MAXILLOFACIAL FINDINGS Osseous: No fracture or mandibular dislocation. No destructive process. Orbits: Negative. No traumatic or inflammatory finding. Sinuses: Mild frothy material within several paranasal sinuses again noted. Nasal septal deviation to the RIGHT is unchanged. Soft tissues: Negative. CT CERVICAL SPINE FINDINGS Alignment: Normal. Skull base and vertebrae: No acute fracture. No primary bone lesion or focal pathologic process. Soft tissues and spinal canal: No prevertebral fluid or swelling. No visible canal hematoma. Disc levels: Mild to moderate degenerative disc disease/spondylosis noted, greatest at C4-5 and C5-6 contributing to mild central spinal and bony foraminal narrowing at these levels. Upper chest: No acute abnormality Other: None IMPRESSION: 1. No evidence of acute intracranial abnormality. Atrophy, remote LEFT basal ganglia/internal capsule infarct and chronic small-vessel white matter ischemic changes. 2. No evidence of acute facial fracture. 3. No evidence of acute injury to the cervical spine. Mild to moderate degenerative disc disease/spondylosis, greatest at C4-5 and C5-6  contributing to mild central spinal and bony foraminal narrowing at these levels. Electronically Signed   By: Harmon Pier M.D.   On: 03/24/2022 15:13   CT Maxillofacial Wo Contrast  Result Date: 03/24/2022 CLINICAL DATA:  88 year old female with head, neck and face pain following fall. EXAM: CT HEAD WITHOUT CONTRAST CT MAXILLOFACIAL WITHOUT CONTRAST CT CERVICAL SPINE WITHOUT CONTRAST TECHNIQUE: Multidetector CT imaging of the head, cervical spine, and maxillofacial structures were performed using the standard protocol without intravenous contrast. Multiplanar CT image reconstructions of the cervical spine and maxillofacial structures were also generated. RADIATION DOSE REDUCTION: This exam was performed according to the departmental dose-optimization program which includes automated exposure control, adjustment of the mA and/or kV according to patient size and/or use of iterative reconstruction technique. COMPARISON:  11/11/2017 CT FINDINGS: CT HEAD FINDINGS Brain: No evidence of acute infarction, hemorrhage, hydrocephalus, extra-axial collection or mass lesion/mass effect. Atrophy, remote LEFT basal ganglia/internal capsule infarct and chronic small-vessel white matter ischemic changes again noted. Vascular: Carotid and vertebral atherosclerotic calcifications are noted. Skull: Normal. Negative for fracture or focal lesion. Other: None. CT MAXILLOFACIAL FINDINGS Osseous: No fracture or mandibular dislocation. No destructive process. Orbits: Negative. No traumatic or inflammatory finding. Sinuses: Mild frothy material within several paranasal sinuses again noted. Nasal septal deviation to the RIGHT is unchanged. Soft tissues: Negative. CT CERVICAL SPINE FINDINGS Alignment: Normal. Skull base and vertebrae: No acute fracture. No primary bone lesion or focal pathologic process. Soft tissues and spinal canal: No prevertebral fluid or swelling. No visible canal hematoma. Disc levels: Mild to moderate degenerative  disc disease/spondylosis noted, greatest at C4-5 and C5-6 contributing to mild central spinal and bony foraminal narrowing at these levels. Upper chest:  No acute abnormality Other: None IMPRESSION: 1. No evidence of acute intracranial abnormality. Atrophy, remote LEFT basal ganglia/internal capsule infarct and chronic small-vessel white matter ischemic changes. 2. No evidence of acute facial fracture. 3. No evidence of acute injury to the cervical spine. Mild to moderate degenerative disc disease/spondylosis, greatest at C4-5 and C5-6 contributing to mild central spinal and bony foraminal narrowing at these levels. Electronically Signed   By: Harmon Pier M.D.   On: 03/24/2022 15:13   CT CHEST ABDOMEN PELVIS W CONTRAST  Result Date: 03/24/2022 CLINICAL DATA:  88 year old female with fall and diffuse pain. EXAM: CT CHEST, ABDOMEN, AND PELVIS WITH CONTRAST TECHNIQUE: Multidetector CT imaging of the chest, abdomen and pelvis was performed following the standard protocol during bolus administration of intravenous contrast. RADIATION DOSE REDUCTION: This exam was performed according to the departmental dose-optimization program which includes automated exposure control, adjustment of the mA and/or kV according to patient size and/or use of iterative reconstruction technique. CONTRAST:  21mL OMNIPAQUE IOHEXOL 350 MG/ML SOLN COMPARISON:  11/11/2017 CT and prior studies FINDINGS: CT CHEST FINDINGS Cardiovascular: UPPER limits normal heart size again noted. Aortic atherosclerotic calcifications again noted. There is no evidence of thoracic aortic aneurysm or pericardial effusion. Mediastinum/Nodes: No enlarged mediastinal, hilar, or axillary lymph nodes. Heterogeneous enlarged LEFT thyroid has previously been evaluated sample. The trachea and esophagus demonstrate no significant findings. Lungs/Pleura: There is no evidence airspace disease, consolidation, mass, suspicious nodule, pleural effusion or pneumothorax. Mild  dependent/bibasilar scarring/atelectasis noted. Mild biapical pleuroparenchymal scarring again identified. Musculoskeletal: Fractures of the posterior LEFT 11th and 12th ribs are age indeterminate. No other acute or suspicious bony abnormalities noted. Remote sternal and RIGHT rib fractures are noted. CT ABDOMEN PELVIS FINDINGS Hepatobiliary: Probable mild hepatic steatosis noted. No focal hepatic abnormalities are present. Gallbladder is unremarkable. There is no evidence of intrahepatic or extrahepatic biliary dilatation. Pancreas: No significant abnormality Spleen: Unremarkable Adrenals/Urinary Tract: The kidneys, adrenal glands and bladder are unremarkable. Stomach/Bowel: There is no evidence of bowel obstruction, bowel wall thickening or inflammatory changes. A moderate to large of stool within the colon is noted with very large amount of stool within the rectum. The appendix is normal. The stomach is unremarkable. Vascular/Lymphatic: Aortic atherosclerosis. No enlarged abdominal or pelvic lymph nodes. Reproductive: No significant abnormality Other: No ascites, focal collection or pneumoperitoneum. Musculoskeletal: No acute or suspicious bony abnormalities are noted. Remote pelvic fractures and degenerative changes in the LOWER lumbar spine again noted. IMPRESSION: 1. Age-indeterminate fractures of the posterior LEFT 11th and 12th ribs. No other evidence of acute injury within the chest, abdomen or pelvis. 2. Moderate to large colonic stool and very large amount of rectal stool. 3. Probable mild hepatic steatosis. Electronically Signed   By: Harmon Pier M.D.   On: 03/24/2022 15:01   DG Shoulder Left  Result Date: 03/24/2022 CLINICAL DATA:  Trauma, fall EXAM: LEFT SHOULDER - 2+ VIEW COMPARISON:  None Available. FINDINGS: No displaced fracture or dislocation is seen. Osteopenia is seen in bony structures. There are no abnormal soft tissue calcifications. Visualized lung fields are clear. Small sclerotic  density in glenoid may suggest benign bone island. Degenerative changes are noted in the visualized lower cervical spine. IMPRESSION: No fracture or dislocation is seen in left shoulder. Cervical spondylosis. Electronically Signed   By: Ernie Avena M.D.   On: 03/24/2022 13:50   DG Hip Unilat W or Wo Pelvis 2-3 Views Left  Result Date: 03/24/2022 CLINICAL DATA:  Trauma, fall EXAM: DG HIP (WITH OR  WITHOUT PELVIS) 2-3V LEFT COMPARISON:  11/11/2017 FINDINGS: No recent displaced fracture or dislocation is seen. In the frogleg lateral view, small smooth marginated calcifications are noted adjacent to the upper aspect of greater trochanter which was also seen in the previous examination. In the same view, there is possible break in the cortical margin in the upper lateral aspect of greater trochanter in the left femur. Deformities are noted in superior and inferior pubic rami on both sides consistent with old healed fractures. Fracture lines seen in right acetabulum in the previous study is not evident in the current study. Marked degenerative changes are noted in lower lumbar spine. IMPRESSION: No recent displaced fracture or dislocation is seen in pelvis and left hip. There is possible break in the cortical margin in the upper lateral aspect of greater trochanter of proximal left femur seen in a single view. This may suggest recent or old undisplaced fracture. Please correlate with clinical symptoms and physical examination findings. There are multiple old healed fractures in both pubic bones. Lumbar spondylosis. Electronically Signed   By: Ernie Avena M.D.   On: 03/24/2022 13:48   DG Elbow 2 Views Left  Result Date: 03/24/2022 CLINICAL DATA:  Fall, elbow pain EXAM: LEFT ELBOW - 2 VIEW COMPARISON:  None Available. FINDINGS: Acute nondisplaced supracondylar fracture of the distal left humerus. Large elbow joint lipohemarthrosis. No dislocation. Diffuse soft tissue swelling about the elbow, most  pronounced medially. IMPRESSION: Acute nondisplaced supracondylar fracture of the distal left humerus. Electronically Signed   By: Duanne Guess D.O.   On: 03/24/2022 13:38   DG Chest Portable 1 View  Result Date: 03/24/2022 CLINICAL DATA:  Trauma, fall EXAM: PORTABLE CHEST 1 VIEW COMPARISON:  11/11/2017 FINDINGS: The heart size and mediastinal contours are within normal limits. Both lungs are clear. The visualized skeletal structures are unremarkable. IMPRESSION: No active disease. Electronically Signed   By: Ernie Avena M.D.   On: 03/24/2022 13:38    Micro Results   Today   Subjective   Ashley Chambers today has no headache,no chest abdominal pain,no new weakness tingling or numbness, feels much better    Objective   Blood pressure (!) 108/51, pulse 65, temperature 97.7 F (36.5 C), resp. rate 15, height  (1.651 m), weight 52 kg, SpO2 95 %.   Intake/Output Summary (Last 24 hours) at 03/28/2022 0859 Last data filed at 03/28/2022 0551 Gross per 24 hour  Intake 960 ml  Output --  Net 960 ml    Exam  Awake Alert, No new F.N deficits,    [place].AT,PERRAL Supple Neck,   Symmetrical Chest wall movement, Good air movement bilaterally, CTAB RRR,No Gallops,   +ve B.Sounds, Abd Soft, Non tender,  No Cyanosis, L arm in sling   Data Review   Recent Labs  Lab 03/24/22 1158 03/25/22 0531  WBC 11.7* 9.8  HGB 14.1 13.1  HCT 40.9 38.3  PLT 209 192  MCV 90.9 90.1  MCH 31.3 30.8  MCHC 34.5 34.2  RDW 13.2 13.6  LYMPHSABS 0.6*  --   MONOABS 0.7  --   EOSABS 0.0  --   BASOSABS 0.0  --     Recent Labs  Lab 03/24/22 1158 03/24/22 2236 03/25/22 0531  NA 136  --  140  K 3.8  --  3.7  CL 101  --  105  CO2 24  --  26  GLUCOSE 153*  --  124*  BUN 17  --  12  CREATININE 0.83  --  0.68  AST 36  --   --   ALT 23  --   --   ALKPHOS 46  --   --   BILITOT 0.5  --   --   ALBUMIN 4.1  --   --   TSH  --  0.471  --   CALCIUM 9.1  --  9.1     Total Time in preparing  paper work, data evaluation and todays exam - 35 minutes  Signature  -    Susa RaringPrashant Temitope Flammer M.D on 03/28/2022 at 8:59 AM   -  To page go to [email or web address]

## 2022-03-28 NOTE — TOC Transition Note (Signed)
Transition of Care [facility]) - CM/SW Discharge Note   Patient Details  Name: Ashley Chambers MRN: [id number] Date of Birth: 10/20/1930  Transition of Care [facility]) CM/SW Contact:  Janae Bridgeman, RN Phone Number: 03/28/2022, 10:03 AM   Clinical Narrative:    Discharge summary, Medical necessity, face-sheet, DNR and prescriptions placed in PTAR packet at secretary's desk to discharge with the patient.  Patient and son were updated of pending transfer to [facility] by [facility] arranged by Claudette Laws, MSW.     Barriers to Discharge: Continued Medical Work up   Patient Goals and CMS Choice Patient states their goals for this hospitalization and ongoing recovery are:: Get stronger soon CMS Medicare.gov Compare Post Acute Care list provided to:: Patient    Discharge Placement                       Discharge Plan and Services In-house Referral: Clinical Social Work                                   Social Determinants of Health (SDOH) Interventions Housing Interventions: Intervention Not Indicated   Readmission Risk Interventions     No data to display

## 2022-03-28 NOTE — Progress Notes (Signed)
Occupational Therapy Treatment Patient Details Name: Ashley Chambers MRN: [id number] DOB: [birth year] Today's Date: 03/28/2022   History of present illness 88 yo female admitted 12/2 after fall at home with Lt humerus fx, UTI and fecal impaction. PMHx: HLD, Rt acetabular fx, dementia, thyroid disease   OT comments   Patient able to recall place and that she had a fall and required education on LUE restrictions. Patient demonstrated improvement with self care with patient able to stand at sink for grooming with min assist and able to perform toilet hygiene seated. Patient continues to require cues for safety. Patient would benefit from further OT in SNF setting for continued therapy to increase independence with self care and transfers. Acute OT to continue to follow.    Recommendations for follow up therapy are one component of a multi-disciplinary discharge planning process, led by the attending physician.  Recommendations may be updated based on patient status, additional functional criteria and insurance authorization.    Follow Up Recommendations  Skilled nursing-short term rehab (<3 hours/day)     Assistance Recommended at Discharge Frequent or constant Supervision/Assistance  Patient can return home with the following  A little help with walking and/or transfers;A little help with bathing/dressing/bathroom;Assistance with cooking/housework;Direct supervision/assist for medications management;Direct supervision/assist for financial management;Assist for transportation;Help with stairs or ramp for entrance   Equipment Recommendations  None recommended by OT    Recommendations for Other Services      Precautions / Restrictions Precautions Precautions: Fall Precaution Comments: LUE sling at all times Required Braces or Orthoses: Sling Restrictions Weight Bearing Restrictions: Yes LUE Weight Bearing: Non weight bearing       Mobility Bed Mobility Overal bed mobility: Needs  Assistance Bed Mobility: Supine to Sit     Supine to sit: Min assist, HOB elevated     General bed mobility comments: assistance with trunk due to not being able to use LUE    Transfers Overall transfer level: Needs assistance Equipment used: 1 person hand held assist Transfers: Sit to/from Stand Sit to Stand: Min assist     Step pivot transfers: Min assist     General transfer comment: min assist for safety once up with cues for safety     Balance Overall balance assessment: Needs assistance Sitting-balance support: Single extremity supported Sitting balance-Leahy Scale: Fair     Standing balance support: Single extremity supported, No upper extremity supported Standing balance-Leahy Scale: Poor Standing balance comment: able to use RUE to perform grooming tasks standing at sink                           ADL either performed or assessed with clinical judgement   ADL Overall ADL's : Needs assistance/impaired     Grooming: Wash/dry hands;Wash/dry face;Oral care;Brushing hair;Minimal assistance;Standing Grooming Details (indicate cue type and reason): assistance with combing hair. Able to use LUE to hod toothbrush while applying toothpaste                 Toilet Transfer: BSC/3in1;Regular Toilet;Minimal assistance Toilet Transfer Details (indicate cue type and reason): HHA to ambulate to bathroom and perform toilet transfer Toileting- Clothing Manipulation and Hygiene: Moderate assistance Toileting - Clothing Manipulation Details (indicate cue type and reason): assistance with clothing management and patient able to perform hygiene seated       General ADL Comments: cues for safety and LUE restrictions    Extremity/Trunk Assessment Upper Extremity Assessment LUE Deficits / Details: splint and sling  due to fx LUE Sensation: WNL LUE Coordination: decreased fine motor;decreased gross motor            Vision       Perception     Praxis       Cognition Arousal/Alertness: Awake/alert Behavior During Therapy: WFL for tasks assessed/performed Overall Cognitive Status: History of cognitive impairments - at baseline Area of Impairment: Orientation, Safety/judgement, Problem solving, Awareness                 Orientation Level: Time Current Attention Level: Focused Memory: Decreased recall of precautions   Safety/Judgement: Decreased awareness of safety Awareness: Intellectual Problem Solving: Slow processing General Comments: aware of place and knew she had a fall but appeared unaware of LUE humerus fx        Exercises      Shoulder Instructions       General Comments      Pertinent Vitals/ Pain       Pain Assessment Pain Assessment: Faces Faces Pain Scale: Hurts a little bit Pain Location: L arm with movement Pain Descriptors / Indicators: Aching, Grimacing Pain Intervention(s): Limited activity within patient's tolerance, Monitored during session, Repositioned  Home Living                                          Prior Functioning/Environment              Frequency           Progress Toward Goals  OT Goals(current goals can now be found in the care plan section)  Progress towards OT goals: Progressing toward goals  Acute Rehab OT Goals Patient Stated Goal: get better OT Goal Formulation: With patient Time For Goal Achievement: 04/08/22 Potential to Achieve Goals: Good ADL Goals Pt Will Perform Grooming: with min guard assist;standing Pt Will Perform Upper Body Dressing: with min assist;sitting Pt Will Perform Lower Body Dressing: with min assist Pt Will Transfer to Toilet: with supervision;ambulating;regular height toilet;bedside commode Pt Will Perform Toileting - Clothing Manipulation and hygiene: with supervision;sitting/lateral leans Additional ADL Goal #1: Pt will state and explain NWB precautions of LUE and the need for them.  Plan Discharge plan remains  appropriate    Co-evaluation                 AM-PAC OT "6 Clicks" Daily Activity     Outcome Measure   Help from another person eating meals?: A Little Help from another person taking care of personal grooming?: A Little Help from another person toileting, which includes using toliet, bedpan, or urinal?: A Lot Help from another person bathing (including washing, rinsing, drying)?: A Lot Help from another person to put on and taking off regular upper body clothing?: A Lot Help from another person to put on and taking off regular lower body clothing?: A Lot 6 Click Score: 14    End of Session Equipment Utilized During Treatment: Gait belt  OT Visit Diagnosis: Unsteadiness on feet (R26.81)   Activity Tolerance Patient tolerated treatment well   Patient Left in chair;with call bell/phone within reach;with chair alarm set   Nurse Communication Mobility status        Time: 3291-9166 OT Time Calculation (min): 26 min  Charges: OT General Charges $OT Visit: 1 Visit OT Treatments $Self Care/Home Management : 23-37 mins  Alfonse Flavors, OTA Acute Rehabilitation Services  Office [phone]  Mattye Verdone Jeannett Senior 03/28/2022, 10:00 AM

## 2022-03-28 NOTE — Plan of Care (Signed)
Pt discharging today to SNF, RN called report to nurse Jonny Ruiz at [facility], all questions answered, pt son at bedside and aware of D/C, ppt son has all of pt belongings packed and son will take her belongings

## 2022-03-28 NOTE — Discharge Instructions (Signed)
Low T-type both column L distal humerus fracture                NWB L UEx                         No lifting with left arm                plan for non-op treatment               Splint for 2-3 weeks then convert to fracture/ROM brace               Sling on at all times                Ok to move digits    Follow with Primary MD Tally Joe, MD in 7 days   Get CBC, BMP, Magnesium -  checked next visit with your primary MD or SNF MD   Activity: As tolerated with Full fall precautions use walker/cane & assistance as needed  Disposition SNF  Diet: Heart Healthy   Special Instructions: If you have smoked or chewed Tobacco  in the last 2 yrs please stop smoking, stop any regular Alcohol  and or any Recreational drug use.  On your next visit with your primary care physician please Get Medicines reviewed and adjusted.  Please request your Prim.MD to go over all Hospital Tests and Procedure/Radiological results at the follow up, please get all Hospital records sent to your Prim MD by signing hospital release before you go home.  If you experience worsening of your admission symptoms, develop shortness of breath, life threatening emergency, suicidal or homicidal thoughts you must seek medical attention immediately by calling 911 or calling your MD immediately  if symptoms less severe.  You Must read complete instructions/literature along with all the possible adverse reactions/side effects for all the Medicines you take and that have been prescribed to you. Take any new Medicines after you have completely understood and accpet all the possible adverse reactions/side effects.

## 2022-03-28 NOTE — Progress Notes (Addendum)
Patient's insurance authorization has been approved.  Patient will complete admission paperwork at 10:30am today and can be transferred to the facility after its completion.  The number to call for report is [phone]. Patient will go to room 501. Patient will be transported via [facility] - RN to call when ready.  Edwin Dada, MSW, LCSW Transitions of Care  Clinical Social Worker II [phone]

## 2022-04-08 DIAGNOSIS — S42472D Displaced transcondylar fracture of left humerus, subsequent encounter for fracture with routine healing: Secondary | ICD-10-CM | POA: Diagnosis not present

## 2022-04-08 DIAGNOSIS — M25512 Pain in left shoulder: Secondary | ICD-10-CM | POA: Diagnosis not present
